# Patient Record
Sex: Female | Born: 1970 | Race: White | Hispanic: No | Marital: Single | Smoking: Former smoker
Health system: Southern US, Community
[De-identification: ages and names within clinical notes are randomized; demographics above are authoritative.]

## PROBLEM LIST (undated history)

## (undated) DIAGNOSIS — I10 Essential (primary) hypertension: Secondary | ICD-10-CM

## (undated) DIAGNOSIS — D249 Benign neoplasm of unspecified breast: Secondary | ICD-10-CM

## (undated) DIAGNOSIS — N63 Unspecified lump in unspecified breast: Secondary | ICD-10-CM

## (undated) DIAGNOSIS — M722 Plantar fascial fibromatosis: Secondary | ICD-10-CM

## (undated) DIAGNOSIS — Z87891 Personal history of nicotine dependence: Secondary | ICD-10-CM

## (undated) DIAGNOSIS — G4730 Sleep apnea, unspecified: Secondary | ICD-10-CM

## (undated) DIAGNOSIS — Z1211 Encounter for screening for malignant neoplasm of colon: Secondary | ICD-10-CM

## (undated) DIAGNOSIS — M7730 Calcaneal spur, unspecified foot: Secondary | ICD-10-CM

## (undated) DIAGNOSIS — E119 Type 2 diabetes mellitus without complications: Secondary | ICD-10-CM

## (undated) HISTORY — DX: Morbid (severe) obesity due to excess calories: E66.01

## (undated) HISTORY — DX: Calcaneal spur, unspecified foot: M77.30

## (undated) HISTORY — DX: Type 2 diabetes mellitus without complications: E11.9

## (undated) HISTORY — DX: Benign neoplasm of unspecified breast: D24.9

## (undated) HISTORY — DX: Unspecified lump in unspecified breast: N63.0

## (undated) HISTORY — DX: Encounter for screening for malignant neoplasm of colon: Z12.11

## (undated) HISTORY — DX: Sleep apnea, unspecified: G47.30

## (undated) HISTORY — DX: Plantar fascial fibromatosis: M72.2

## (undated) HISTORY — DX: Personal history of nicotine dependence: Z87.891

## (undated) HISTORY — DX: Essential (primary) hypertension: I10

---

## 2008-02-12 ENCOUNTER — Other Ambulatory Visit: Payer: Self-pay

## 2008-02-12 ENCOUNTER — Emergency Department: Payer: Self-pay | Admitting: Emergency Medicine

## 2008-05-12 ENCOUNTER — Ambulatory Visit: Payer: Self-pay | Admitting: Internal Medicine

## 2009-11-10 ENCOUNTER — Ambulatory Visit: Payer: Self-pay | Admitting: *Deleted

## 2010-06-25 HISTORY — PX: COLONOSCOPY: SHX174

## 2010-08-21 ENCOUNTER — Ambulatory Visit: Payer: Self-pay | Admitting: Internal Medicine

## 2010-09-14 ENCOUNTER — Ambulatory Visit: Payer: Self-pay | Admitting: Gastroenterology

## 2010-09-18 LAB — PATHOLOGY REPORT

## 2010-10-02 ENCOUNTER — Other Ambulatory Visit: Payer: Self-pay | Admitting: Physician Assistant

## 2010-10-12 ENCOUNTER — Ambulatory Visit: Payer: Self-pay | Admitting: Gastroenterology

## 2010-10-18 ENCOUNTER — Ambulatory Visit: Payer: Self-pay | Admitting: Gastroenterology

## 2011-06-12 ENCOUNTER — Emergency Department: Payer: Self-pay | Admitting: Emergency Medicine

## 2011-06-15 ENCOUNTER — Ambulatory Visit: Payer: Self-pay | Admitting: Internal Medicine

## 2011-06-26 DIAGNOSIS — I10 Essential (primary) hypertension: Secondary | ICD-10-CM

## 2011-06-26 DIAGNOSIS — D249 Benign neoplasm of unspecified breast: Secondary | ICD-10-CM

## 2011-06-26 HISTORY — DX: Benign neoplasm of unspecified breast: D24.9

## 2011-06-26 HISTORY — DX: Essential (primary) hypertension: I10

## 2012-01-17 ENCOUNTER — Ambulatory Visit: Payer: Self-pay

## 2012-06-25 HISTORY — PX: LAPAROSCOPIC GASTRIC SLEEVE RESECTION: SHX5895

## 2012-11-03 ENCOUNTER — Encounter: Payer: Self-pay | Admitting: *Deleted

## 2013-01-16 ENCOUNTER — Ambulatory Visit: Payer: Self-pay | Admitting: Specialist

## 2013-01-16 DIAGNOSIS — Z01810 Encounter for preprocedural cardiovascular examination: Secondary | ICD-10-CM

## 2013-01-16 LAB — COMPREHENSIVE METABOLIC PANEL
Alkaline Phosphatase: 104 U/L (ref 50–136)
Anion Gap: 4 — ABNORMAL LOW (ref 7–16)
BUN: 14 mg/dL (ref 7–18)
Calcium, Total: 8.7 mg/dL (ref 8.5–10.1)
Chloride: 104 mmol/L (ref 98–107)
Osmolality: 276 (ref 275–301)
SGPT (ALT): 24 U/L (ref 12–78)

## 2013-01-16 LAB — CBC WITH DIFFERENTIAL/PLATELET
Basophil #: 0 10*3/uL (ref 0.0–0.1)
Basophil %: 0.2 %
Eosinophil #: 0.2 10*3/uL (ref 0.0–0.7)
Eosinophil %: 4.4 %
HCT: 34.6 % — ABNORMAL LOW (ref 35.0–47.0)
HGB: 12.1 g/dL (ref 12.0–16.0)
MCH: 31.7 pg (ref 26.0–34.0)
MCHC: 35.1 g/dL (ref 32.0–36.0)
MCV: 90 fL (ref 80–100)
Monocyte #: 0.4 x10 3/mm (ref 0.2–0.9)
Neutrophil %: 65.2 %
Platelet: 280 10*3/uL (ref 150–440)
RBC: 3.83 10*6/uL (ref 3.80–5.20)
RDW: 14.3 % (ref 11.5–14.5)
WBC: 5.4 10*3/uL (ref 3.6–11.0)

## 2013-01-16 LAB — IRON AND TIBC
Iron Saturation: 33 %
Unbound Iron-Bind.Cap.: 215 ug/dL

## 2013-01-16 LAB — PROTIME-INR
INR: 1
Prothrombin Time: 12.9 secs (ref 11.5–14.7)

## 2013-01-16 LAB — FERRITIN: Ferritin (ARMC): 53 ng/mL (ref 8–388)

## 2013-01-16 LAB — AMYLASE: Amylase: 28 U/L (ref 25–115)

## 2013-01-16 LAB — HEMOGLOBIN A1C: Hemoglobin A1C: 6.6 % — ABNORMAL HIGH (ref 4.2–6.3)

## 2013-01-16 LAB — MAGNESIUM: Magnesium: 1.6 mg/dL — ABNORMAL LOW

## 2013-01-16 LAB — TSH: Thyroid Stimulating Horm: 1.3 u[IU]/mL

## 2013-01-16 LAB — PHOSPHORUS: Phosphorus: 2.8 mg/dL (ref 2.5–4.9)

## 2013-01-22 ENCOUNTER — Ambulatory Visit: Payer: Self-pay | Admitting: General Surgery

## 2013-02-13 ENCOUNTER — Ambulatory Visit: Payer: Self-pay | Admitting: Specialist

## 2013-02-23 ENCOUNTER — Ambulatory Visit: Payer: Self-pay | Admitting: Specialist

## 2013-02-24 ENCOUNTER — Encounter: Payer: Self-pay | Admitting: *Deleted

## 2013-04-28 ENCOUNTER — Ambulatory Visit: Payer: Self-pay | Admitting: Specialist

## 2013-05-12 ENCOUNTER — Inpatient Hospital Stay: Payer: Self-pay | Admitting: Surgery

## 2013-05-12 LAB — CREATININE, SERUM
Creatinine: 0.63 mg/dL (ref 0.60–1.30)
EGFR (Non-African Amer.): >60

## 2013-05-13 LAB — PHOSPHORUS: Phosphorus: 3 mg/dL (ref 2.5–4.9)

## 2013-05-13 LAB — CBC WITH DIFFERENTIAL/PLATELET
Basophil %: 0.2 %
Eosinophil #: 0 10*3/uL (ref 0.0–0.7)
HCT: 33.5 % — ABNORMAL LOW (ref 35.0–47.0)
Lymphocyte #: 1 10*3/uL (ref 1.0–3.6)
Lymphocyte %: 13.9 %
MCHC: 34.6 g/dL (ref 32.0–36.0)
MCV: 91 fL (ref 80–100)
Monocyte %: 9.1 %
Neutrophil #: 5.5 10*3/uL (ref 1.4–6.5)
Neutrophil %: 76.7 %
Platelet: 219 10*3/uL (ref 150–440)
RBC: 3.68 10*6/uL — ABNORMAL LOW (ref 3.80–5.20)
RDW: 13.2 % (ref 11.5–14.5)
WBC: 7.2 10*3/uL (ref 3.6–11.0)

## 2013-05-13 LAB — BASIC METABOLIC PANEL
Anion Gap: 4 — ABNORMAL LOW (ref 7–16)
BUN: 14 mg/dL (ref 7–18)
Calcium, Total: 7.9 mg/dL — ABNORMAL LOW (ref 8.5–10.1)
Chloride: 101 mmol/L (ref 98–107)
Creatinine: 0.59 mg/dL — ABNORMAL LOW (ref 0.60–1.30)
EGFR (African American): >60
Osmolality: 277 (ref 275–301)
Potassium: 3.2 mmol/L — ABNORMAL LOW (ref 3.5–5.1)

## 2013-05-13 LAB — ALBUMIN: Albumin: 3.3 g/dL — ABNORMAL LOW (ref 3.4–5.0)

## 2013-05-13 LAB — MAGNESIUM: Magnesium: 2.3 mg/dL

## 2013-05-14 LAB — PATHOLOGY REPORT

## 2013-06-02 ENCOUNTER — Ambulatory Visit: Payer: Self-pay | Admitting: Specialist

## 2013-06-25 ENCOUNTER — Ambulatory Visit: Payer: Self-pay | Admitting: Specialist

## 2013-09-17 ENCOUNTER — Emergency Department: Payer: Self-pay | Admitting: Emergency Medicine

## 2013-09-17 LAB — BASIC METABOLIC PANEL
ANION GAP: 3 — AB (ref 7–16)
BUN: 16 mg/dL (ref 7–18)
Calcium, Total: 8.9 mg/dL (ref 8.5–10.1)
Chloride: 104 mmol/L (ref 98–107)
Co2: 31 mmol/L (ref 21–32)
Creatinine: 0.51 mg/dL — ABNORMAL LOW (ref 0.60–1.30)
EGFR (African American): >60
EGFR (Non-African Amer.): >60
GLUCOSE: 100 mg/dL — AB (ref 65–99)
OSMOLALITY: 277 (ref 275–301)
Potassium: 3.5 mmol/L (ref 3.5–5.1)
Sodium: 138 mmol/L (ref 136–145)

## 2013-09-17 LAB — CBC
HCT: 39.7 % (ref 35.0–47.0)
HGB: 13.4 g/dL (ref 12.0–16.0)
MCH: 31.4 pg (ref 26.0–34.0)
MCHC: 33.7 g/dL (ref 32.0–36.0)
MCV: 93 fL (ref 80–100)
Platelet: 249 10*3/uL (ref 150–440)
RBC: 4.25 10*6/uL (ref 3.80–5.20)
RDW: 14 % (ref 11.5–14.5)
WBC: 4.8 10*3/uL (ref 3.6–11.0)

## 2013-09-17 LAB — HCG, QUANTITATIVE, PREGNANCY: Beta Hcg, Quant.: <1 m[IU]/mL — ABNORMAL LOW

## 2014-04-26 ENCOUNTER — Encounter: Payer: Self-pay | Admitting: *Deleted

## 2014-10-15 NOTE — Op Note (Signed)
PATIENT NAME:  Chloe Reeves, Chloe Reeves MR#:  [id number] DATE OF BIRTH:  03/30/1971  DATE OF PROCEDURE:  05/12/2013  PREOPERATIVE DIAGNOSIS: Morbid obesity.  POSTOPERATIVE DIAGNOSIS: Morbid obesity and hiatal hernia.  PROCEDURE: Laparoscopic sleeve gastrectomy with hiatal hernia repair.   SURGEON: Kreg Shropshire, MD  ASSISTANT: Valaria Good, PA.   ANESTHESIA: General endotracheal.   CLINICAL HISTORY: See history and physical.   DETAILS OF PROCEDURE: The patient was taken to the operating room and placed on the operating room table, in the supine position, with appropriate monitors and supplemental oxygen being delivered.  Broad spectrum IV antibiotics were administered. The patient was placed under general anesthesia without incident.  The abdomen was prepped and draped in the usual sterile fashion.  Access was obtained using 5 mm Optical trocar. Pneumoperitoneum was established without difficulty. Multiple other ports were placed in preparation for sleeve gastrectomy. A liver retractor was placed without incident. The entire stomach was mobilized from 5 cm from the pylorus all the way up to the fundus, and the fundus was mobilized off the left crura as well completely freeing up the posterior portion of the stomach. Posterior attachments were taken down so the crura could be visualized from both sides.  At that point, Seamguard was used to buttress all staple fires and no provocative leak test was performed. everything was removed from the stomach and a Visi-G 36-French was placed down into the antrum. An Echelon green load stapler was used to bisect the antrum on first fire starting approximately 5 to 6 cm from the pylorus. I then continued up along the Bougie using a blue load stapler with excellent affect with care not to get too close to the Bougie itself, with minimal traction. This continued all the way up to the left crura. The excess stomach was placed on the side and the Bougie was removed and endoscopy  showed no evidence of obstruction at that time.  The excess stomach was removed through the abdominal cavity, and the wounds were closed using 4-0 Vicryl and Dermabond.  The hiatal hernia was coapted in the following fashion: I dissected the right and left crura freer away from the esophagus with the Visi-G in place and then reapproximated them using interrupted Ethibond sutures.  ____________________________ Kreg Shropshire, MD jb:aw D: 05/12/2013 14:12:33 ET T: 05/12/2013 15:03:51 ET JOB#: [id number]  cc: Kreg Shropshire, MD, <Dictator> Bonner Puna MD ELECTRONICALLY SIGNED 05/12/2013 15:46

## 2014-10-15 NOTE — H&P (Signed)
   Subjective/Chief Complaint morbid obesity   History of Present Illness for sleeve surgery   Past Med/Surgical Hx:  sleep apnea:   diabetes:   htn:   borderline HTN:   denies:   ALLERGIES:  Benadryl: Hives, Anaphylaxis, Agitation, Anxiety  Physical Exam:  GEN well developed, well nourished, obese   HEENT PERRL   NECK supple   RESP normal resp effort   CARD regular rate   ABD denies tenderness   LYMPH negative neck   SKIN normal to palpation    Assessment/Admission Diagnosis morbid obesity   Plan laparoscopic sleeve gastrectomy   Electronic Signatures: Bonner Puna (MD)  (Signed [phone] 12:48)  Authored: CHIEF COMPLAINT and HISTORY, PAST MEDICAL/SURGIAL HISTORY, ALLERGIES, PHYSICAL EXAM, ASSESSMENT AND PLAN   Last Updated: 18-Nov-14 12:48 by Bonner Puna (MD)

## 2016-03-14 ENCOUNTER — Encounter: Payer: Self-pay | Admitting: Intensive Care

## 2016-03-14 ENCOUNTER — Emergency Department: Payer: Self-pay

## 2016-03-14 ENCOUNTER — Inpatient Hospital Stay
Admission: EM | Admit: 2016-03-14 | Discharge: 2016-03-20 | DRG: 357 | Disposition: A | Discharge status: Home / Self Care | Payer: Self-pay | Attending: Surgery | Admitting: Surgery

## 2016-03-14 DIAGNOSIS — E119 Type 2 diabetes mellitus without complications: Secondary | ICD-10-CM | POA: Diagnosis present

## 2016-03-14 DIAGNOSIS — K56609 Unspecified intestinal obstruction, unspecified as to partial versus complete obstruction: Secondary | ICD-10-CM | POA: Diagnosis present

## 2016-03-14 DIAGNOSIS — Z888 Allergy status to other drugs, medicaments and biological substances status: Secondary | ICD-10-CM

## 2016-03-14 DIAGNOSIS — Z6841 Body Mass Index (BMI) 40.0 and over, adult: Secondary | ICD-10-CM

## 2016-03-14 DIAGNOSIS — I10 Essential (primary) hypertension: Secondary | ICD-10-CM | POA: Diagnosis present

## 2016-03-14 DIAGNOSIS — K5669 Other intestinal obstruction: Secondary | ICD-10-CM

## 2016-03-14 DIAGNOSIS — Z87891 Personal history of nicotine dependence: Secondary | ICD-10-CM

## 2016-03-14 DIAGNOSIS — Z23 Encounter for immunization: Secondary | ICD-10-CM

## 2016-03-14 DIAGNOSIS — Z9884 Bariatric surgery status: Secondary | ICD-10-CM

## 2016-03-14 DIAGNOSIS — G4730 Sleep apnea, unspecified: Secondary | ICD-10-CM | POA: Diagnosis present

## 2016-03-14 DIAGNOSIS — K5660 Unspecified intestinal obstruction: Principal | ICD-10-CM | POA: Diagnosis present

## 2016-03-14 DIAGNOSIS — R110 Nausea: Secondary | ICD-10-CM

## 2016-03-14 DIAGNOSIS — R1084 Generalized abdominal pain: Secondary | ICD-10-CM

## 2016-03-14 LAB — CBC
HCT: 40.5 % (ref 35.0–47.0)
Hemoglobin: 14 g/dL (ref 12.0–16.0)
MCH: 31.8 pg (ref 26.0–34.0)
MCHC: 34.6 g/dL (ref 32.0–36.0)
MCV: 91.7 fL (ref 80.0–100.0)
PLATELETS: 308 10*3/uL (ref 150–440)
RBC: 4.42 MIL/uL (ref 3.80–5.20)
RDW: 13.3 % (ref 11.5–14.5)
WBC: 12.1 10*3/uL — ABNORMAL HIGH (ref 3.6–11.0)

## 2016-03-14 LAB — URINALYSIS COMPLETE WITH MICROSCOPIC (ARMC ONLY)
Bacteria, UA: NONE SEEN
Bilirubin Urine: NEGATIVE
Glucose, UA: NEGATIVE mg/dL
Ketones, ur: NEGATIVE mg/dL
Leukocytes, UA: NEGATIVE
Nitrite: NEGATIVE
Protein, ur: NEGATIVE mg/dL
Specific Gravity, Urine: 1.008 (ref 1.005–1.030)
pH: 5 (ref 5.0–8.0)

## 2016-03-14 LAB — COMPREHENSIVE METABOLIC PANEL
ALK PHOS: 80 U/L (ref 38–126)
ALT: 17 U/L (ref 14–54)
AST: 21 U/L (ref 15–41)
Albumin: 4.1 g/dL (ref 3.5–5.0)
Anion gap: 7 (ref 5–15)
BUN: 14 mg/dL (ref 6–20)
CALCIUM: 9.5 mg/dL (ref 8.9–10.3)
CO2: 30 mmol/L (ref 22–32)
CREATININE: 0.61 mg/dL (ref 0.44–1.00)
Chloride: 101 mmol/L (ref 101–111)
GFR calc non Af Amer: >60 mL/min (ref >60)
GLUCOSE: 182 mg/dL — AB (ref 65–99)
Potassium: 4.1 mmol/L (ref 3.5–5.1)
SODIUM: 138 mmol/L (ref 135–145)
Total Bilirubin: 0.5 mg/dL (ref 0.3–1.2)
Total Protein: 7.9 g/dL (ref 6.5–8.1)

## 2016-03-14 LAB — POCT PREGNANCY, URINE: Preg Test, Ur: NEGATIVE

## 2016-03-14 LAB — LIPASE, BLOOD: Lipase: 20 U/L (ref 11–51)

## 2016-03-14 MED ORDER — KCL IN DEXTROSE-NACL 20-5-0.45 MEQ/L-%-% IV SOLN
INTRAVENOUS | Status: DC
  Administered 2016-03-14 – 2016-03-19 (×7): via INTRAVENOUS
  Filled 2016-03-14 (×9): qty 1000

## 2016-03-14 MED ORDER — ACETAMINOPHEN 650 MG RE SUPP: 650.0000 mg | Freq: Four times a day (QID) | RECTAL | Status: DC | PRN

## 2016-03-14 MED ORDER — LISINOPRIL 20 MG PO TABS
20.0000 mg | ORAL_TABLET | Freq: Every day | ORAL | Status: DC
  Filled 2016-03-14 (×2): qty 1

## 2016-03-14 MED ORDER — IOPAMIDOL (ISOVUE-300) INJECTION 61%
100.0000 mL | Freq: Once | INTRAVENOUS | Status: AC | PRN
  Administered 2016-03-14: 100 mL via INTRAVENOUS

## 2016-03-14 MED ORDER — ACETAMINOPHEN 500 MG PO TABS
1000.0000 mg | ORAL_TABLET | ORAL | Status: AC
  Administered 2016-03-14: 1000 mg via ORAL
  Filled 2016-03-14: qty 2

## 2016-03-14 MED ORDER — ONDANSETRON HCL 4 MG/2ML IJ SOLN
4.0000 mg | Freq: Once | INTRAMUSCULAR | Status: AC
  Administered 2016-03-14: 4 mg via INTRAVENOUS
  Filled 2016-03-14: qty 2

## 2016-03-14 MED ORDER — DIATRIZOATE MEGLUMINE & SODIUM 66-10 % PO SOLN: 15.0000 mL | Freq: Once | ORAL | Status: DC

## 2016-03-14 MED ORDER — HYDROCHLOROTHIAZIDE 25 MG PO TABS
25.0000 mg | ORAL_TABLET | Freq: Every day | ORAL | Status: DC
  Filled 2016-03-14 (×2): qty 1

## 2016-03-14 MED ORDER — LISINOPRIL-HYDROCHLOROTHIAZIDE 20-25 MG PO TABS: 1.0000 | ORAL_TABLET | Freq: Every day | ORAL | Status: DC

## 2016-03-14 MED ORDER — FAMOTIDINE IN NACL 20-0.9 MG/50ML-% IV SOLN
20.0000 mg | Freq: Two times a day (BID) | INTRAVENOUS | Status: DC
  Administered 2016-03-14 – 2016-03-20 (×11): 20 mg via INTRAVENOUS
  Filled 2016-03-14 (×15): qty 50

## 2016-03-14 MED ORDER — ENOXAPARIN SODIUM 40 MG/0.4ML ~~LOC~~ SOLN
40.0000 mg | SUBCUTANEOUS | Status: DC
  Administered 2016-03-14 – 2016-03-18 (×3): 40 mg via SUBCUTANEOUS
  Filled 2016-03-14 (×3): qty 0.4

## 2016-03-14 MED ORDER — MEDROXYPROGESTERONE ACETATE 10 MG PO TABS
10.0000 mg | ORAL_TABLET | Freq: Every day | ORAL | Status: DC
  Filled 2016-03-14 (×4): qty 1

## 2016-03-14 MED ORDER — MORPHINE SULFATE (PF) 4 MG/ML IV SOLN
4.0000 mg | INTRAVENOUS | Status: DC | PRN
  Administered 2016-03-15 (×2): 4 mg via INTRAVENOUS
  Filled 2016-03-14 (×2): qty 1

## 2016-03-14 MED ORDER — ACETAMINOPHEN 325 MG PO TABS: 650.0000 mg | ORAL_TABLET | Freq: Four times a day (QID) | ORAL | Status: DC | PRN

## 2016-03-14 MED ORDER — DIATRIZOATE MEGLUMINE & SODIUM 66-10 % PO SOLN: 30.0000 mL | Freq: Once | ORAL | Status: DC

## 2016-03-14 MED ORDER — INFLUENZA VAC SPLIT QUAD 0.5 ML IM SUSY
0.5000 mL | PREFILLED_SYRINGE | INTRAMUSCULAR | Status: AC
  Administered 2016-03-15: 0.5 mL via INTRAMUSCULAR
  Filled 2016-03-14: qty 0.5

## 2016-03-14 MED ORDER — PAROXETINE HCL 20 MG PO TABS
20.0000 mg | ORAL_TABLET | Freq: Every day | ORAL | Status: DC
  Filled 2016-03-14 (×3): qty 1

## 2016-03-14 MED ORDER — SODIUM CHLORIDE 0.9 % IV BOLUS (SEPSIS)
1000.0000 mL | Freq: Once | INTRAVENOUS | Status: AC
  Administered 2016-03-14: 1000 mL via INTRAVENOUS

## 2016-03-14 MED ORDER — IOPAMIDOL (ISOVUE-300) INJECTION 61%
30.0000 mL | Freq: Once | INTRAVENOUS | Status: AC | PRN
  Administered 2016-03-14: 30 mL via ORAL

## 2016-03-14 MED ORDER — HYDROCODONE-ACETAMINOPHEN 5-325 MG PO TABS
1.0000 | ORAL_TABLET | ORAL | Status: DC | PRN
  Administered 2016-03-14 (×2): 2 via ORAL
  Filled 2016-03-14 (×2): qty 2

## 2016-03-14 NOTE — ED Provider Notes (Signed)
[facility] Emergency Department Provider Note   ____________________________________________   First MD Initiated Contact with Patient 03/14/16 [phone]     (approximate)  I have reviewed the triage vital signs and the nursing notes.   HISTORY  Chief Complaint Abdominal Pain    HPI Chloe Reeves is a 45 y.o. female reports in her normal health, this morning about 3 AM she was awoken by sharp abdominal pain in the middle of her abdomen. Says he mild nausea. No fevers or chills but she did feel "warm and cold sweats".  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 She reports that the pain has started to subside, and is now very mild to minimal and located about the area of her belly button. Felt as though the pain is going to make her vomit but did not. She felt very lightheaded and reports that the pain is making her feel like she could pass out  No chest pain. No measured fever. No changes in urination, no abnormal odor or blood in her urine. She has a previous history of gastric bypass   Past Medical History:  Diagnosis Date  . 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 Diabetes mellitus without complication ([place])   . Heel spur   . Hypertension 2013  . Lump or mass in breast   . Morbid obesity ([place])   . Personal history of tobacco use, presenting hazards to health   . Plantar fasciitis   . Sleep apnea   . Special screening for malignant neoplasms, colon     Patient Active Problem List   Diagnosis Date Noted  . SBO (small bowel obstruction) ([place]) 03/14/2016    Past Surgical History:  Procedure Laterality Date  . COLONOSCOPY  2012   Dr. Ernst Breach    Prior to Admission medications   Medication Sig Start Date End Date Taking? Authorizing Provider  acetaminophen (TYLENOL) 325 MG tablet Take 650 mg by mouth every 6 (six) hours as needed.   Yes  Historical Provider, MD    Allergies Scopolamine and Benadryl [diphenhydramine hcl]  Family History  Problem Relation Age of Onset  . Cancer Other     pt is unsure of details regarding FH of cancer    Social History Social History  Substance Use Topics  . Smoking status: Former Research scientist (life sciences)  . Smokeless tobacco: Never Used  . Alcohol use Yes     Comment: Occ    Review of Systems Constitutional: No fever/chills. Felt warm earlier Eyes: No visual changes. ENT: No sore throat. Cardiovascular: Denies chest pain. Respiratory: Denies shortness of breath. Gastrointestinal:  No diarrhea.  No constipation. Genitourinary: Negative for dysuria. Musculoskeletal: Negative for back pain. Skin: Negative for rash. Neurological: Negative for headaches, focal weakness or numbness.  10-point ROS otherwise negative.  ____________________________________________   PHYSICAL EXAM:  VITAL SIGNS: ED Triage Vitals  Enc Vitals Group     BP 03/14/16 0710 140/82     Pulse Rate 03/14/16 0708 88     Resp 03/14/16 0708 16     Temp 03/14/16 0708 98.1 F (36.7 C)     Temp Source 03/14/16 0708 Oral     SpO2 03/14/16 0708 97 %     Weight 03/14/16 0709 225 lb (102.1 kg)     Height 03/14/16 0709 5\' 2"  (1.575 m)     Head Circumference --  Peak Flow --      Pain Score 03/14/16 0713 10     Pain Loc --      Pain Edu? --      Excl. in [place]? --     Constitutional: Alert and oriented. Well appearing and in no acute distress. Eyes: Conjunctivae are normal. PERRL. EOMI. Head: Atraumatic. Nose: No congestion/rhinnorhea. Mouth/Throat: Mucous membranes are moist.  Oropharynx non-erythematous. Neck: No stridor.   Cardiovascular: Normal rate, regular rhythm. Grossly normal heart sounds.  Good peripheral circulation. Respiratory: Normal respiratory effort.  No retractions. Lungs CTAB. Gastrointestinal: Soft and nontender except for mild tenderness periumbilically without hernia, rebound or guarding. No  pain in the lower abdomen. No distention. Denies vaginal bleeding, discharge, or being at risk to be pregnant. Musculoskeletal: No lower extremity tenderness nor edema.  No joint effusions. Neurologic:  Normal speech and language. No gross focal neurologic deficits are appreciated.  Skin:  Skin is warm, dry and intact. No rash noted. Psychiatric: Mood and affect are normal. Speech and behavior are normal.  ____________________________________________   LABS (all labs ordered are listed, but only abnormal results are displayed)  Labs Reviewed  COMPREHENSIVE METABOLIC PANEL - Abnormal; Notable for the following:       Result Value   Glucose, Bld 182 (*)    All other components within normal limits  CBC - Abnormal; Notable for the following:    WBC 12.1 (*)    All other components within normal limits  URINALYSIS COMPLETEWITH MICROSCOPIC ([facility] ONLY) - Abnormal; Notable for the following:    Color, Urine YELLOW (*)    APPearance CLEAR (*)    Hgb urine dipstick 1+ (*)    Squamous Epithelial / LPF 0-5 (*)    All other components within normal limits  LIPASE, BLOOD  POC URINE PREG, ED  POCT PREGNANCY, URINE   ____________________________________________  EKG   ____________________________________________  RADIOLOGY  Ct Abdomen Pelvis W Contrast  Result Date: 03/14/2016 CLINICAL DATA:  Patient is status post gastric bypass procedure. Lower abdominal pain with nausea and vomiting, acute EXAM: CT ABDOMEN AND PELVIS WITH CONTRAST TECHNIQUE: Multidetector CT imaging of the abdomen and pelvis was performed using the standard protocol following bolus administration of intravenous contrast. Oral contrast was also administered. CONTRAST:  13mL ISOVUE-300 IOPAMIDOL (ISOVUE-300) INJECTION 61% COMPARISON:  June 13, 2011 FINDINGS: Lower chest: Lung bases are clear. Hepatobiliary: No focal liver lesions are identified. Gallbladder wall is not appreciably thickened. There is no biliary duct  dilatation. Pancreas: There is no pancreatic mass or inflammatory focus. Spleen: No splenic lesions are evident. Adrenals/Urinary Tract: Adrenals appear unremarkable. Kidneys bilaterally show no evident mass or hydronephrosis on either side. There is no renal or ureteral calculus on either side. Urinary bladder is midline with wall thickness within normal limits. Stomach/Bowel: The patient is status post gastric bypass procedure. There is no wall thickening or fluid in the region of the previous gastric bypass. There are loops of dilated small bowel in the right mid abdomen with a transition zone just beyond this area of bowel dilatation. There is bowel wall thickening in this area. There is surrounding mesenteric thickening in this area. The appearance is consistent with a developing closed loop obstruction from internal hernia in the right mid abdomen. There is no free air or portal venous air. Bowel elsewhere appears unremarkable. Vascular/Lymphatic: There is no abdominal aortic aneurysm. No vascular lesions are evident on this study. No adenopathy is appreciable in the abdomen or pelvis. Reproductive: Uterus is  anteverted. There is no pelvic mass or pelvic fluid collection. Other: Appendix appears unremarkable. There is no abscess. A small amount of ascites is noted near the liver edge. Musculoskeletal: There are no blastic or lytic bone lesions. There are scattered foci of degenerative change in the lower thoracic and lumbar spine. No intramuscular or abdominal wall lesion. IMPRESSION: Patient is status post gastric bypass procedure. Developing closed loop small-bowel obstruction in the right mid abdomen. There is a transition zone in this area. Contrast does extend through this area. There is wall thickening in the area of the internal hernia/closed loop as well as mesenteric thickening. No free air. Small amount of ascites. No abscess. Appendix region appears normal. No renal or ureteral calculus. No  hydronephrosis. Critical Value/emergent results were called by telephone at the time of interpretation on 03/14/2016 at 9:35 am to Dr. Delman Kitten , who verbally acknowledged these results. Critical Value/emergent results were called by telephone at the time of interpretation on 03/14/2016 at 9:35 am to Dr. Delman Kitten , who verbally acknowledged these results. Electronically Signed   By: Lowella Grip III M.D.   On: 03/14/2016 09:35    ____________________________________________   PROCEDURES  Procedure(s) performed: None  Procedures  Critical Care performed: No  ____________________________________________   INITIAL IMPRESSION / ASSESSMENT AND PLAN / ED COURSE  Pertinent labs & imaging results that were available during my care of the patient were reviewed by me and considered in my medical decision making (see chart for details).  Differential diagnosis includes but is not limited to, abdominal perforation, aortic dissection, cholecystitis, appendicitis, diverticulitis, colitis, esophagitis/gastritis, kidney stone, pyelonephritis, urinary tract infection, aortic aneurysm. All are considered in decision and treatment plan. Based upon the patient's presentation and risk factors, and the patient's previous history of a gastric bypass who proceed with CT scan. Overall I think it is reassuring that her pain has improved without treatment, but given the patient's previous history of a bypass and a report of severe pain is now subsiding we'll proceed to evaluate for acute conditions.  Patient reports that she would like to be able to drive home, she cannot tolerate NSAIDs, and thus we will give her Tylenol but the patient last for stronger pain medicine if required. Currently she is resting comfortably reports her pain is very minimal and is much better than it was when it woke her up.  Overall nontoxic well appearing.  ----------------------------------------- 10:55 AM on  03/14/2016 -----------------------------------------   10 AM - case discussed with Dr. Pat Patrick, he is currently in the OR, but recommends that we contact patient's bariatric surgeon regarding today's concerns of bowel obstruction. Patient understands, she is currently resting comfortably reports pain is a 5, no vomiting, awaiting word from her surgeon  ----------------------------------------- 10:48 AM on 03/14/2016 -----------------------------------------  Two pages called into the patient's physician, Dr. Synthia Innocent office, no return call back yet.  ----------------------------------------- 10:55 AM on 03/14/2016 -----------------------------------------  Dr. Synthia Innocent PA, Romie Levee called back. Discussed case with her and she advises that Dr. Darnell Level does not have general surgical privileges at [facility], they advised having general surgery see and perform consultation on the patient. Dr. Pat Patrick paged for consult.  Clarified with patient, patient had a gastric sleeve procedure. Discussed with Dr. Pat Patrick, he is currently in the OR but will see patient once out. He is comfortable resting at this time.   Clinical Course     ____________________________________________   FINAL CLINICAL IMPRESSION(S) / ED DIAGNOSES  Final diagnoses:  Small  bowel obstruction ([facility])      NEW MEDICATIONS STARTED DURING THIS VISIT:  New Prescriptions   No medications on file     Note:  This document was prepared using Dragon voice recognition software and may include unintentional dictation errors.     Delman Kitten, MD 03/14/16 [phone]

## 2016-03-14 NOTE — ED Notes (Signed)
Transferring to 2C-219

## 2016-03-14 NOTE — ED Triage Notes (Signed)
Pt presents to ER with lower abdominal pain that woke her up from her sleep at 0300. Pt reports Nausea and vomiting X2 since this AM. Pt states " Im breaking out in cold sweats and I feel like Im going to pass out" Pt ambulatory in triage. Pt leaning over holding stomach. A&O x4

## 2016-03-14 NOTE — H&P (Signed)
Chloe Reeves is a 45 y.o. female  in her normal state of good health until 3:00 morning when she developed severe abdominal pain.  HPI: She developed the sudden onset of abdominal discomfort primarily to the right side approximately 3:00 in the morning. It awoke her from a dead sleep. Pain was intense without any mitigating or exacerbating factors. She became nauseated later in the morning and vomited once although not a great deal. The pain improved once patient began to vomit. She denied any fever but did have significant diaphoresis. He is similar problem. Her only previous GI problems as been gastric sleeve stapling procedure. She denies history of hepatitis yellow jaundice pancreatitis peptic ulcer disease gallbladder disease or diverticulitis. She has no other major medical problems.  Her last bowel movement was early this morning. She's not been passing gas. Of note, is the fact she's had no narcotic pain medication while she's been in the emergency room and her pain has improved. She has minimal abdominal pain or tenderness present time. He is no longer vomiting.  Workup revealed a slightly elevated white blood cell count of 12,000. CT scan demonstrated a" developing closed-loop obstruction". Contrast did go through the entire small bowel. There did not appear to be an obvious volvulus. I did not see any evidence of an internal hernia on the CT scan. Surgical service was consulted.  Past Medical History:  Diagnosis Date  . 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 Diabetes mellitus without complication ([place])   . Heel spur   . Hypertension 2013  . Lump or mass in breast   . Morbid obesity ([place])   . Personal history of tobacco use, presenting hazards to health   . Plantar fasciitis   . Sleep apnea   . Special screening for malignant neoplasms, colon    Past Surgical History:  Procedure Laterality Date  . COLONOSCOPY  2012   Dr. Ernst Breach   Social History    Social History  . Marital status: Married    Spouse name: N/A  . Number of children: N/A  . Years of education: N/A   Social History Main Topics  . Smoking status: Former Research scientist (life sciences)  . Smokeless tobacco: Never Used  . Alcohol use Yes     Comment: Occ  . Drug use: No  . Sexual activity: Not Asked   Other Topics Concern  . None   Social History Narrative  . None     Review of Systems  Constitutional: Positive for diaphoresis. Negative for chills and fever.  HENT: Negative.   Eyes: Negative.   Respiratory: Negative for cough, shortness of breath and wheezing.   Cardiovascular: Negative for chest pain and palpitations.  Gastrointestinal: Positive for abdominal pain, nausea and vomiting. Negative for diarrhea and heartburn.  Genitourinary: Negative.   Musculoskeletal: Negative.   Skin: Negative.   Neurological: Negative.   Psychiatric/Behavioral: Negative.      PHYSICAL EXAM: BP 127/72   Pulse 62   Temp 98.1 F (36.7 C) (Oral)   Resp 16   Ht 5\' 2"  (1.575 m)   Wt 102.1 kg (225 lb)   LMP 02/26/2016   SpO2 98%   BMI 41.15 kg/m   Physical Exam  Constitutional: She is oriented to person, place, and time. She appears well-developed and well-nourished. No distress.  HENT:  Head: Normocephalic and atraumatic.  Eyes: EOM are normal. Pupils are equal, round, and reactive to light.  Neck: Normal range of motion. Neck supple.  Cardiovascular: Normal rate and regular rhythm.   Pulmonary/Chest: Effort normal and breath sounds normal.  Abdominal: Soft. Bowel sounds are normal. She exhibits no distension. There is no tenderness. There is no rebound and no guarding.  Musculoskeletal: Normal range of motion. She exhibits no edema or deformity.  Neurological: She is alert and oriented to person, place, and time.  Skin: Skin is warm and dry. She is not diaphoretic.  Psychiatric: She has a normal mood and affect. Her behavior is normal.   Her abdominal exam is unremarkable. She  does not have any significant abdominal tenderness. I cannot palpate a mass. She has no rebound or guarding.  Impression/Plan: I have independently reviewed her CT scan. Her proximal stomach and small bowel is not dilated and her distal small bowel was not dilated but there is a segment in the center of her small bowel in the right abdomen that does appear to be distended. Contrast flows from the stomach to the colon without difficulty. There does not appear to be any evidence of a volvulus. I am concerned about possibility that she had a closed loop obstruction 1 time but she is pain-free at the present time with no nausea or vomiting. Her abdominal exam is unremarkable. She's never had any previous similar symptoms. Nothing nasogastric decompression will be of any benefit since her stomach is not distended and she is no longer vomiting.  We will admit her to the hospital treat her with IV rehydration and some pain medication see how she does have the next few hours. I do not see an indication for urgent surgical intervention at this time. She is in agreement.   Dia Crawford III, MD  03/14/2016, 12:47 PM

## 2016-03-14 NOTE — ED Notes (Signed)
Called Dr. Synthia Innocent office a second time at 1032, initial call was at 1005.  Expressed to them this was a repeat call

## 2016-03-14 NOTE — Progress Notes (Signed)
   Visited with patient this evening. Patient reports that she does feel much improved than from the previous evening. She does endorse however that she has had some return of abdominal discomfort. It is mild in comparison to last night. She states that when she drinks liquids the discomfort intensified. She denies any nausea, vomiting.  On exam her abdomen is soft, mildly tender to deep palpation in the midline. She is not distended and has no guarding or rebound.  Had a [place] conversation with the patient about the complexity of her situation. Given that her pain is improved but not gone away discussed that it is still possible that she will require operative intervention. Discussed that any point should her pain intensified is to let her nurse know immediately so that I can reevaluate her. Otherwise, the plan is for repeat abdominal x-ray in the morning to visualize whether or not the contrast has made it past the area of concern seen on CT scan. Discussed that if the contrast is not progressing at that could be an indication for surgery. However, as [place] as the contrast makes it past the area of concern and into her colon then it would be evidence of an incomplete or partial obstruction and decrease the likelihood of needing surgery. Patient voiced understanding.  Clayburn Pert, MD [facility]

## 2016-03-14 NOTE — ED Notes (Signed)
Informed RN bed ready 

## 2016-03-15 ENCOUNTER — Observation Stay: Payer: Self-pay

## 2016-03-15 LAB — CBC
HCT: 35.1 % (ref 35.0–47.0)
HEMOGLOBIN: 12.2 g/dL (ref 12.0–16.0)
MCH: 32.4 pg (ref 26.0–34.0)
MCHC: 34.8 g/dL (ref 32.0–36.0)
MCV: 93.2 fL (ref 80.0–100.0)
Platelets: 252 10*3/uL (ref 150–440)
RBC: 3.76 MIL/uL — AB (ref 3.80–5.20)
RDW: 13.3 % (ref 11.5–14.5)
WBC: 5.3 10*3/uL (ref 3.6–11.0)

## 2016-03-15 LAB — BASIC METABOLIC PANEL
ANION GAP: 5 (ref 5–15)
BUN: <5 mg/dL — ABNORMAL LOW (ref 6–20)
CHLORIDE: 105 mmol/L (ref 101–111)
CO2: 30 mmol/L (ref 22–32)
Calcium: 8.3 mg/dL — ABNORMAL LOW (ref 8.9–10.3)
Creatinine, Ser: 0.43 mg/dL — ABNORMAL LOW (ref 0.44–1.00)
GFR calc non Af Amer: >60 mL/min (ref >60)
Glucose, Bld: 117 mg/dL — ABNORMAL HIGH (ref 65–99)
POTASSIUM: 3.6 mmol/L (ref 3.5–5.1)
Sodium: 140 mmol/L (ref 135–145)

## 2016-03-15 MED ORDER — CEFAZOLIN (ANCEF) 1 G IV SOLR
1.0000 g | INTRAVENOUS | Status: AC
  Administered 2016-03-16: 1 g
  Filled 2016-03-15: qty 1

## 2016-03-15 NOTE — Progress Notes (Signed)
Subjective:   She started a soft diet today and began to develop abdominal pain once again. It is not as severe and is not associated with any nausea or vomiting. However, there is significant change in her pain with eating solid food. Plain films revealed no evidence of any obstruction and contrast had passed into her decompressed colon. Her white blood cell count is down to 5000.  Vital signs in last 24 hours: Temp:  [97.6 F (36.4 C)-98.1 F (36.7 C)] 97.6 F (36.4 C) (09/21 0444) Pulse Rate:  [65-75] 65 (09/21 1414) Resp:  [17-18] 18 (09/21 0444) BP: (128-153)/(68-83) 153/82 (09/21 1414) SpO2:  [97 %-99 %] 97 % (09/21 1414) Last BM Date: 03/14/16  Intake/Output from previous day: 09/20 0701 - 09/21 0700 In: 984.5 [I.V.:984.5] Out: 1100 [Urine:1100]  Exam:  Her abdomen is soft nontender with no significant guarding no rebound with good bowel sounds.  Lab Results:  CBC  Recent Labs  03/14/16 0717 03/15/16 0531  WBC 12.1* 5.3  HGB 14.0 12.2  HCT 40.5 35.1  PLT 308 252   CMP     Component Value Date/Time   NA 140 03/15/2016 0531   NA 138 09/17/2013 1701   K 3.6 03/15/2016 0531   K 3.5 09/17/2013 1701   CL 105 03/15/2016 0531   CL 104 09/17/2013 1701   CO2 30 03/15/2016 0531   CO2 31 09/17/2013 1701   GLUCOSE 117 (H) 03/15/2016 0531   GLUCOSE 100 (H) 09/17/2013 1701   BUN <5 (L) 03/15/2016 0531   BUN 16 09/17/2013 1701   CREATININE 0.43 (L) 03/15/2016 0531   CREATININE 0.51 (L) 09/17/2013 1701   CALCIUM 8.3 (L) 03/15/2016 0531   CALCIUM 8.9 09/17/2013 1701   PROT 7.9 03/14/2016 0717   PROT 7.4 01/16/2013 1013   ALBUMIN 4.1 03/14/2016 0717   ALBUMIN 3.3 (L) 05/13/2013 0419   AST 21 03/14/2016 0717   AST 15 01/16/2013 1013   ALT 17 03/14/2016 0717   ALT 24 01/16/2013 1013   ALKPHOS 80 03/14/2016 0717   ALKPHOS 104 01/16/2013 1013   BILITOT 0.5 03/14/2016 0717   BILITOT 0.4 01/16/2013 1013   GFRNONAA >60 03/15/2016 0531   GFRNONAA >60 09/17/2013 1701    GFRAA >60 03/15/2016 0531   GFRAA >60 09/17/2013 1701   PT/INR No results for input(s): LABPROT, INR in the last 72 hours.  Studies/Results: Ct Abdomen Pelvis W Contrast  Result Date: 03/14/2016 CLINICAL DATA:  Patient is status post gastric bypass procedure. Lower abdominal pain with nausea and vomiting, acute EXAM: CT ABDOMEN AND PELVIS WITH CONTRAST TECHNIQUE: Multidetector CT imaging of the abdomen and pelvis was performed using the standard protocol following bolus administration of intravenous contrast. Oral contrast was also administered. CONTRAST:  146mL ISOVUE-300 IOPAMIDOL (ISOVUE-300) INJECTION 61% COMPARISON:  June 13, 2011 FINDINGS: Lower chest: Lung bases are clear. Hepatobiliary: No focal liver lesions are identified. Gallbladder wall is not appreciably thickened. There is no biliary duct dilatation. Pancreas: There is no pancreatic mass or inflammatory focus. Spleen: No splenic lesions are evident. Adrenals/Urinary Tract: Adrenals appear unremarkable. Kidneys bilaterally show no evident mass or hydronephrosis on either side. There is no renal or ureteral calculus on either side. Urinary bladder is midline with wall thickness within normal limits. Stomach/Bowel: The patient is status post gastric bypass procedure. There is no wall thickening or fluid in the region of the previous gastric bypass. There are loops of dilated small bowel in the right mid abdomen with a transition zone  just beyond this area of bowel dilatation. There is bowel wall thickening in this area. There is surrounding mesenteric thickening in this area. The appearance is consistent with a developing closed loop obstruction from internal hernia in the right mid abdomen. There is no free air or portal venous air. Bowel elsewhere appears unremarkable. Vascular/Lymphatic: There is no abdominal aortic aneurysm. No vascular lesions are evident on this study. No adenopathy is appreciable in the abdomen or pelvis.  Reproductive: Uterus is anteverted. There is no pelvic mass or pelvic fluid collection. Other: Appendix appears unremarkable. There is no abscess. A small amount of ascites is noted near the liver edge. Musculoskeletal: There are no blastic or lytic bone lesions. There are scattered foci of degenerative change in the lower thoracic and lumbar spine. No intramuscular or abdominal wall lesion. IMPRESSION: Patient is status post gastric bypass procedure. Developing closed loop small-bowel obstruction in the right mid abdomen. There is a transition zone in this area. Contrast does extend through this area. There is wall thickening in the area of the internal hernia/closed loop as well as mesenteric thickening. No free air. Small amount of ascites. No abscess. Appendix region appears normal. No renal or ureteral calculus. No hydronephrosis. Critical Value/emergent results were called by telephone at the time of interpretation on 03/14/2016 at 9:35 am to Dr. Delman Kitten , who verbally acknowledged these results. Critical Value/emergent results were called by telephone at the time of interpretation on 03/14/2016 at 9:35 am to Dr. Delman Kitten , who verbally acknowledged these results. Electronically Signed   By: Lowella Grip III M.D.   On: 03/14/2016 09:35   Dg Abd 2 Views  Result Date: 03/15/2016 CLINICAL DATA:  Pt states she is having a little abdominal discomfort this morning, hx of gastric sleeve 2014. EXAM: ABDOMEN - 2 VIEW COMPARISON:  03/14/2016 FINDINGS: Bowel gas pattern is nonobstructive. Residual contrast is identified within nondilated loops of colon. No evidence for free intraperitoneal air on these supine views. Visualized osseous structures have a normal appearance. IMPRESSION: No evidence for acute  abnormality. Electronically Signed   By: Nolon Nations M.D.   On: 03/15/2016 08:55    Assessment/Plan: With her persistent pain with eating particularly the increasing pain over the course of the day  I'm concerned that she does have a high-grade partial small bowel obstruction. Because it may be partially closed loop she does not have 3 course of decompression. We will going to back are also clear liquids and plan laparoscopy possible laparotomy tomorrow morning for a presumed lysis of adhesions. This plans been discussed with the patient in detail and she is in agreement.

## 2016-03-15 NOTE — Progress Notes (Signed)
Subjective:   She feels much better today although she continues to have some mild abdominal pain. Her white blood cell count is down at her plain films show contrast in her colon with no evidence of any obstruction. Overall she feels improved.  Vital signs in last 24 hours: Temp:  [97.6 F (36.4 C)-98.1 F (36.7 C)] 97.6 F (36.4 C) (09/21 0444) Pulse Rate:  [62-75] 75 (09/21 0444) Resp:  [16-18] 18 (09/21 0444) BP: (123-141)/(60-83) 141/83 (09/21 0444) SpO2:  [98 %-99 %] 99 % (09/21 0444) Last BM Date: 03/14/16  Intake/Output from previous day: 09/20 0701 - 09/21 0700 In: 984.5 [I.V.:984.5] Out: 1100 [Urine:1100]  Exam:  Her abdomen is soft with no significant abdominal tenderness no rebound no guarding. Her lungs are clear bilaterally with no adventitious sounds.  Lab Results:  CBC  Recent Labs  03/14/16 0717 03/15/16 0531  WBC 12.1* 5.3  HGB 14.0 12.2  HCT 40.5 35.1  PLT 308 252   CMP     Component Value Date/Time   NA 140 03/15/2016 0531   NA 138 09/17/2013 1701   K 3.6 03/15/2016 0531   K 3.5 09/17/2013 1701   CL 105 03/15/2016 0531   CL 104 09/17/2013 1701   CO2 30 03/15/2016 0531   CO2 31 09/17/2013 1701   GLUCOSE 117 (H) 03/15/2016 0531   GLUCOSE 100 (H) 09/17/2013 1701   BUN <5 (L) 03/15/2016 0531   BUN 16 09/17/2013 1701   CREATININE 0.43 (L) 03/15/2016 0531   CREATININE 0.51 (L) 09/17/2013 1701   CALCIUM 8.3 (L) 03/15/2016 0531   CALCIUM 8.9 09/17/2013 1701   PROT 7.9 03/14/2016 0717   PROT 7.4 01/16/2013 1013   ALBUMIN 4.1 03/14/2016 0717   ALBUMIN 3.3 (L) 05/13/2013 0419   AST 21 03/14/2016 0717   AST 15 01/16/2013 1013   ALT 17 03/14/2016 0717   ALT 24 01/16/2013 1013   ALKPHOS 80 03/14/2016 0717   ALKPHOS 104 01/16/2013 1013   BILITOT 0.5 03/14/2016 0717   BILITOT 0.4 01/16/2013 1013   GFRNONAA >60 03/15/2016 0531   GFRNONAA >60 09/17/2013 1701   GFRAA >60 03/15/2016 0531   GFRAA >60 09/17/2013 1701   PT/INR No results for  input(s): LABPROT, INR in the last 72 hours.  Studies/Results: Ct Abdomen Pelvis W Contrast  Result Date: 03/14/2016 CLINICAL DATA:  Patient is status post gastric bypass procedure. Lower abdominal pain with nausea and vomiting, acute EXAM: CT ABDOMEN AND PELVIS WITH CONTRAST TECHNIQUE: Multidetector CT imaging of the abdomen and pelvis was performed using the standard protocol following bolus administration of intravenous contrast. Oral contrast was also administered. CONTRAST:  142mL ISOVUE-300 IOPAMIDOL (ISOVUE-300) INJECTION 61% COMPARISON:  June 13, 2011 FINDINGS: Lower chest: Lung bases are clear. Hepatobiliary: No focal liver lesions are identified. Gallbladder wall is not appreciably thickened. There is no biliary duct dilatation. Pancreas: There is no pancreatic mass or inflammatory focus. Spleen: No splenic lesions are evident. Adrenals/Urinary Tract: Adrenals appear unremarkable. Kidneys bilaterally show no evident mass or hydronephrosis on either side. There is no renal or ureteral calculus on either side. Urinary bladder is midline with wall thickness within normal limits. Stomach/Bowel: The patient is status post gastric bypass procedure. There is no wall thickening or fluid in the region of the previous gastric bypass. There are loops of dilated small bowel in the right mid abdomen with a transition zone just beyond this area of bowel dilatation. There is bowel wall thickening in this area. There is surrounding  mesenteric thickening in this area. The appearance is consistent with a developing closed loop obstruction from internal hernia in the right mid abdomen. There is no free air or portal venous air. Bowel elsewhere appears unremarkable. Vascular/Lymphatic: There is no abdominal aortic aneurysm. No vascular lesions are evident on this study. No adenopathy is appreciable in the abdomen or pelvis. Reproductive: Uterus is anteverted. There is no pelvic mass or pelvic fluid collection. Other:  Appendix appears unremarkable. There is no abscess. A small amount of ascites is noted near the liver edge. Musculoskeletal: There are no blastic or lytic bone lesions. There are scattered foci of degenerative change in the lower thoracic and lumbar spine. No intramuscular or abdominal wall lesion. IMPRESSION: Patient is status post gastric bypass procedure. Developing closed loop small-bowel obstruction in the right mid abdomen. There is a transition zone in this area. Contrast does extend through this area. There is wall thickening in the area of the internal hernia/closed loop as well as mesenteric thickening. No free air. Small amount of ascites. No abscess. Appendix region appears normal. No renal or ureteral calculus. No hydronephrosis. Critical Value/emergent results were called by telephone at the time of interpretation on 03/14/2016 at 9:35 am to Dr. Delman Kitten , who verbally acknowledged these results. Critical Value/emergent results were called by telephone at the time of interpretation on 03/14/2016 at 9:35 am to Dr. Delman Kitten , who verbally acknowledged these results. Electronically Signed   By: Lowella Grip III M.D.   On: 03/14/2016 09:35   Dg Abd 2 Views  Result Date: 03/15/2016 CLINICAL DATA:  Pt states she is having a little abdominal discomfort this morning, hx of gastric sleeve 2014. EXAM: ABDOMEN - 2 VIEW COMPARISON:  03/14/2016 FINDINGS: Bowel gas pattern is nonobstructive. Residual contrast is identified within nondilated loops of colon. No evidence for free intraperitoneal air on these supine views. Visualized osseous structures have a normal appearance. IMPRESSION: No evidence for acute  abnormality. Electronically Signed   By: Nolon Nations M.D.   On: 03/15/2016 08:55    Assessment/Plan: We will start her on a diet today and see how she does over the next 24 hours. If she continues to have symptoms we'll consider repeating her CT scan removing surgical intervention. She is in  agreement with this plan.

## 2016-03-16 ENCOUNTER — Observation Stay: Payer: Self-pay | Admitting: Anesthesiology

## 2016-03-16 ENCOUNTER — Encounter
Admission: EM | Disposition: A | Discharge status: Home / Self Care | Payer: Self-pay | Source: Home / Self Care | Attending: Surgery

## 2016-03-16 ENCOUNTER — Encounter: Payer: Self-pay | Admitting: *Deleted

## 2016-03-16 HISTORY — PX: LAPAROSCOPY: SHX197

## 2016-03-16 SURGERY — LAPAROSCOPY, DIAGNOSTIC
Anesthesia: General

## 2016-03-16 MED ORDER — HYDROMORPHONE HCL 1 MG/ML IJ SOLN
1.0000 mg | INTRAMUSCULAR | Status: DC | PRN
  Administered 2016-03-16: 1 mg via INTRAVENOUS
  Filled 2016-03-16 (×2): qty 1

## 2016-03-16 MED ORDER — ROCURONIUM BROMIDE 100 MG/10ML IV SOLN
INTRAVENOUS | Status: DC | PRN
  Administered 2016-03-16 (×2): 20 mg via INTRAVENOUS

## 2016-03-16 MED ORDER — OXYCODONE HCL 5 MG/5ML PO SOLN: 5.0000 mg | Freq: Once | ORAL | Status: DC | PRN

## 2016-03-16 MED ORDER — MORPHINE SULFATE (PF) 2 MG/ML IV SOLN
2.0000 mg | INTRAVENOUS | Status: DC | PRN
  Administered 2016-03-16 – 2016-03-20 (×7): 2 mg via INTRAVENOUS
  Filled 2016-03-16 (×9): qty 1

## 2016-03-16 MED ORDER — ONDANSETRON HCL 4 MG/2ML IJ SOLN
INTRAMUSCULAR | Status: DC | PRN
  Administered 2016-03-16: 4 mg via INTRAVENOUS

## 2016-03-16 MED ORDER — BUPIVACAINE HCL (PF) 0.25 % IJ SOLN
INTRAMUSCULAR | Status: DC | PRN
  Administered 2016-03-16: 30 mL

## 2016-03-16 MED ORDER — FENTANYL CITRATE (PF) 100 MCG/2ML IJ SOLN
25.0000 ug | INTRAMUSCULAR | Status: DC | PRN
  Administered 2016-03-16 (×3): 50 ug via INTRAVENOUS

## 2016-03-16 MED ORDER — SODIUM CHLORIDE 0.9 % IV SOLN
INTRAVENOUS | Status: DC | PRN
  Administered 2016-03-16: 09:00:00 via INTRAVENOUS

## 2016-03-16 MED ORDER — MIDAZOLAM HCL 2 MG/2ML IJ SOLN
INTRAMUSCULAR | Status: DC | PRN
  Administered 2016-03-16: 2 mg via INTRAVENOUS

## 2016-03-16 MED ORDER — BUPIVACAINE HCL (PF) 0.25 % IJ SOLN
INTRAMUSCULAR | Status: AC
  Filled 2016-03-16: qty 30

## 2016-03-16 MED ORDER — LIDOCAINE HCL (CARDIAC) 20 MG/ML IV SOLN
INTRAVENOUS | Status: DC | PRN
  Administered 2016-03-16: 100 mg via INTRAVENOUS

## 2016-03-16 MED ORDER — SUGAMMADEX SODIUM 200 MG/2ML IV SOLN
INTRAVENOUS | Status: DC | PRN
  Administered 2016-03-16: 200 mg via INTRAVENOUS

## 2016-03-16 MED ORDER — FENTANYL CITRATE (PF) 100 MCG/2ML IJ SOLN
INTRAMUSCULAR | Status: AC
  Filled 2016-03-16: qty 4

## 2016-03-16 MED ORDER — OXYCODONE HCL 5 MG PO TABS: 5.0000 mg | ORAL_TABLET | Freq: Once | ORAL | Status: DC | PRN

## 2016-03-16 MED ORDER — FENTANYL CITRATE (PF) 100 MCG/2ML IJ SOLN
INTRAMUSCULAR | Status: DC | PRN
  Administered 2016-03-16: 100 ug via INTRAVENOUS
  Administered 2016-03-16 (×2): 50 ug via INTRAVENOUS
  Administered 2016-03-16: 100 ug via INTRAVENOUS

## 2016-03-16 MED ORDER — HYDROMORPHONE HCL 1 MG/ML IJ SOLN
0.5000 mg | INTRAMUSCULAR | Status: AC | PRN
  Administered 2016-03-16 (×4): 0.5 mg via INTRAVENOUS

## 2016-03-16 MED ORDER — HYDROMORPHONE HCL 1 MG/ML IJ SOLN
INTRAMUSCULAR | Status: AC
  Administered 2016-03-16: 1 mg via INTRAVENOUS
  Filled 2016-03-16: qty 2

## 2016-03-16 MED ORDER — SUCCINYLCHOLINE CHLORIDE 20 MG/ML IJ SOLN
INTRAMUSCULAR | Status: DC | PRN
  Administered 2016-03-16: 100 mg via INTRAVENOUS

## 2016-03-16 MED ORDER — OXYCODONE-ACETAMINOPHEN 5-325 MG PO TABS
1.0000 | ORAL_TABLET | ORAL | Status: DC | PRN
  Administered 2016-03-16 – 2016-03-20 (×12): 2 via ORAL
  Filled 2016-03-16 (×12): qty 2

## 2016-03-16 MED ORDER — DEXAMETHASONE SODIUM PHOSPHATE 10 MG/ML IJ SOLN
INTRAMUSCULAR | Status: DC | PRN
  Administered 2016-03-16: 5 mg via INTRAVENOUS

## 2016-03-16 MED ORDER — EPHEDRINE SULFATE 50 MG/ML IJ SOLN
INTRAMUSCULAR | Status: DC | PRN
  Administered 2016-03-16 (×3): 5 mg via INTRAVENOUS

## 2016-03-16 MED ORDER — PROPOFOL 10 MG/ML IV BOLUS
INTRAVENOUS | Status: DC | PRN
  Administered 2016-03-16: 200 mg via INTRAVENOUS

## 2016-03-16 SURGICAL SUPPLY — 55 items
CANISTER SUCT 1200ML W/VALVE (MISCELLANEOUS) ×3 IMPLANT
CANNULA DILATOR 10 W/SLV (CANNULA) ×2 IMPLANT
CANNULA DILATOR 10MM W/SLV (CANNULA) ×1
CATH TRAY 16F METER LATEX (MISCELLANEOUS) IMPLANT
CHLORAPREP W/TINT 26ML (MISCELLANEOUS) ×3 IMPLANT
CLOSURE WOUND 1/2 X4 (GAUZE/BANDAGES/DRESSINGS) ×2
DRAPE INCISE IOBAN 66X45 STRL (DRAPES) ×3 IMPLANT
DRAPE LAPAROTOMY 100X77 ABD (DRAPES) ×3 IMPLANT
DRAPE STERI IOBAN 125X83 (DRAPES) ×3 IMPLANT
DRSG TEGADERM 2-3/8X2-3/4 SM (GAUZE/BANDAGES/DRESSINGS) ×3 IMPLANT
DRSG TELFA 3X8 NADH (GAUZE/BANDAGES/DRESSINGS) ×3 IMPLANT
ELECT CAUTERY NEEDLE TIP 1.0 (MISCELLANEOUS) ×3
ELECT REM PT RETURN 9FT ADLT (ELECTROSURGICAL) ×3
ELECTRODE CAUTERY NEDL TIP 1.0 (MISCELLANEOUS) ×1 IMPLANT
ELECTRODE REM PT RTRN 9FT ADLT (ELECTROSURGICAL) ×1 IMPLANT
GAUZE SPONGE 4X4 12PLY STRL (GAUZE/BANDAGES/DRESSINGS) ×3 IMPLANT
GLOVE BIO SURGEON STRL SZ7.5 (GLOVE) ×6 IMPLANT
GLOVE INDICATOR 8.0 STRL GRN (GLOVE) ×6 IMPLANT
GOWN STRL REUS W/ TWL LRG LVL3 (GOWN DISPOSABLE) ×4 IMPLANT
GOWN STRL REUS W/TWL LRG LVL3 (GOWN DISPOSABLE) ×8
GRASPER SUT TROCAR 14GX15 (MISCELLANEOUS) ×3 IMPLANT
IRRIGATION STRYKERFLOW (MISCELLANEOUS) ×1 IMPLANT
IRRIGATOR STRYKERFLOW (MISCELLANEOUS) ×3
KIT RM TURNOVER STRD PROC AR (KITS) ×3 IMPLANT
LABEL OR SOLS (LABEL) ×3 IMPLANT
LIGASURE MARYLAND LAP STAND (ELECTROSURGICAL) IMPLANT
NEEDLE FILTER BLUNT 18X 1/2SAF (NEEDLE) ×2
NEEDLE FILTER BLUNT 18X1 1/2 (NEEDLE) ×1 IMPLANT
NEEDLE HYPO 22GX1.5 SAFETY (NEEDLE) ×3 IMPLANT
NS IRRIG 1000ML POUR BTL (IV SOLUTION) ×3 IMPLANT
NS IRRIG 500ML POUR BTL (IV SOLUTION) ×3 IMPLANT
PACK BASIN MAJOR ARMC (MISCELLANEOUS) ×3 IMPLANT
PACK COLON CLEAN CLOSURE (MISCELLANEOUS) IMPLANT
PACK LAP CHOLECYSTECTOMY (MISCELLANEOUS) ×3 IMPLANT
SLEEVE ENDOPATH XCEL 5M (ENDOMECHANICALS) IMPLANT
STAPLER SKIN PROX 35W (STAPLE) ×3 IMPLANT
STRIP CLOSURE SKIN 1/2X4 (GAUZE/BANDAGES/DRESSINGS) ×4 IMPLANT
SUT ETHILON 5-0 FS-2 18 BLK (SUTURE) IMPLANT
SUT MAXON ABS #0 GS21 30IN (SUTURE) IMPLANT
SUT PDS AB 1 TP1 96 (SUTURE) IMPLANT
SUT SILK 3-0 (SUTURE)
SUT SILK 3-0 SH-1 18XCR BRD (SUTURE)
SUT VIC AB 0 CT1 36 (SUTURE) ×3 IMPLANT
SUT VIC AB 0 CT2 27 (SUTURE) ×9 IMPLANT
SUT VIC AB 3-0 SH 27 (SUTURE)
SUT VIC AB 3-0 SH 27X BRD (SUTURE) IMPLANT
SUT VICRYL 0 AB UR-6 (SUTURE) ×3 IMPLANT
SUT VICRYL+ 3-0 144IN (SUTURE) IMPLANT
SUTURE SILK 3-0 SH-1 18XCR BRD (SUTURE) IMPLANT
SYRINGE 10CC LL (SYRINGE) ×3 IMPLANT
TROCAR Z-THREAD FIOS 11X100 BL (TROCAR) ×6 IMPLANT
TROCAR Z-THREAD OPTICAL 5X100M (TROCAR) ×3 IMPLANT
TROCAR Z-THREAD SLEEVE 11X100 (TROCAR) IMPLANT
TUBING INSUFFLATOR HI FLOW (MISCELLANEOUS) ×3 IMPLANT
WATER STERILE IRR 1000ML POUR (IV SOLUTION) ×3 IMPLANT

## 2016-03-16 NOTE — Progress Notes (Signed)
Patient stated she had minimal relief after taking dilaudid, patient stated morphine worked better. Notified MD about patient request, new orders received. Pt ambulated to bathroom to void, resting in bed now. Continue to assess.

## 2016-03-16 NOTE — Anesthesia Postprocedure Evaluation (Signed)
Anesthesia Post Note  Patient: Chloe Reeves  Procedure(s) Performed: Procedure(s) (LRB): LAPAROSCOPY DIAGNOSTIC (N/A)  Patient location during evaluation: PACU Anesthesia Type: General Level of consciousness: awake and alert Pain management: pain level controlled Vital Signs Assessment: post-procedure vital signs reviewed and stable Respiratory status: spontaneous breathing, nonlabored ventilation, respiratory function stable and patient connected to nasal cannula oxygen Cardiovascular status: blood pressure returned to baseline and stable Postop Assessment: no signs of nausea or vomiting Anesthetic complications: no    Last Vitals:  Vitals:   03/16/16 1129 03/16/16 1149  BP:  117/71  Pulse: 60 75  Resp:  18  Temp: 36.6 C 36.9 C    Last Pain:  Vitals:   03/16/16 1149  TempSrc: Oral  PainSc:                  Precious Haws Piscitello

## 2016-03-16 NOTE — Progress Notes (Signed)
Subjective:   She continues to have intermittent pain but certainly at a low level. She's not passing any gas has not had a bowel movement. She is overall stable for surgical intervention today.  Vital signs in last 24 hours: Temp:  [97.7 F (36.5 C)-98.4 F (36.9 C)] 98.4 F (36.9 C) (09/22 0531) Pulse Rate:  [65-71] 71 (09/22 0531) Resp:  [16-18] 17 (09/22 0531) BP: (123-153)/(68-82) 149/68 (09/22 0531) SpO2:  [97 %-99 %] 98 % (09/22 0531) Last BM Date: 03/14/16  Intake/Output from previous day: 09/21 0701 - 09/22 0700 In: [id number] [P.O.:800; I.V.:854; IV Piggyback:50] Out: 2000 [Urine:2000]  Exam:  Her exam is unremarkable and unchanged from yesterday. She has minimal abdominal tenderness.  Lab Results:  CBC  Recent Labs  03/14/16 0717 03/15/16 0531  WBC 12.1* 5.3  HGB 14.0 12.2  HCT 40.5 35.1  PLT 308 252   CMP     Component Value Date/Time   NA 140 03/15/2016 0531   NA 138 09/17/2013 1701   K 3.6 03/15/2016 0531   K 3.5 09/17/2013 1701   CL 105 03/15/2016 0531   CL 104 09/17/2013 1701   CO2 30 03/15/2016 0531   CO2 31 09/17/2013 1701   GLUCOSE 117 (H) 03/15/2016 0531   GLUCOSE 100 (H) 09/17/2013 1701   BUN <5 (L) 03/15/2016 0531   BUN 16 09/17/2013 1701   CREATININE 0.43 (L) 03/15/2016 0531   CREATININE 0.51 (L) 09/17/2013 1701   CALCIUM 8.3 (L) 03/15/2016 0531   CALCIUM 8.9 09/17/2013 1701   PROT 7.9 03/14/2016 0717   PROT 7.4 01/16/2013 1013   ALBUMIN 4.1 03/14/2016 0717   ALBUMIN 3.3 (L) 05/13/2013 0419   AST 21 03/14/2016 0717   AST 15 01/16/2013 1013   ALT 17 03/14/2016 0717   ALT 24 01/16/2013 1013   ALKPHOS 80 03/14/2016 0717   ALKPHOS 104 01/16/2013 1013   BILITOT 0.5 03/14/2016 0717   BILITOT 0.4 01/16/2013 1013   GFRNONAA >60 03/15/2016 0531   GFRNONAA >60 09/17/2013 1701   GFRAA >60 03/15/2016 0531   GFRAA >60 09/17/2013 1701   PT/INR No results for input(s): LABPROT, INR in the last 72 hours.  Studies/Results: Ct Abdomen Pelvis W  Contrast  Result Date: 03/14/2016 CLINICAL DATA:  Patient is status post gastric bypass procedure. Lower abdominal pain with nausea and vomiting, acute EXAM: CT ABDOMEN AND PELVIS WITH CONTRAST TECHNIQUE: Multidetector CT imaging of the abdomen and pelvis was performed using the standard protocol following bolus administration of intravenous contrast. Oral contrast was also administered. CONTRAST:  168mL ISOVUE-300 IOPAMIDOL (ISOVUE-300) INJECTION 61% COMPARISON:  June 13, 2011 FINDINGS: Lower chest: Lung bases are clear. Hepatobiliary: No focal liver lesions are identified. Gallbladder wall is not appreciably thickened. There is no biliary duct dilatation. Pancreas: There is no pancreatic mass or inflammatory focus. Spleen: No splenic lesions are evident. Adrenals/Urinary Tract: Adrenals appear unremarkable. Kidneys bilaterally show no evident mass or hydronephrosis on either side. There is no renal or ureteral calculus on either side. Urinary bladder is midline with wall thickness within normal limits. Stomach/Bowel: The patient is status post gastric bypass procedure. There is no wall thickening or fluid in the region of the previous gastric bypass. There are loops of dilated small bowel in the right mid abdomen with a transition zone just beyond this area of bowel dilatation. There is bowel wall thickening in this area. There is surrounding mesenteric thickening in this area. The appearance is consistent with a developing closed loop obstruction  from internal hernia in the right mid abdomen. There is no free air or portal venous air. Bowel elsewhere appears unremarkable. Vascular/Lymphatic: There is no abdominal aortic aneurysm. No vascular lesions are evident on this study. No adenopathy is appreciable in the abdomen or pelvis. Reproductive: Uterus is anteverted. There is no pelvic mass or pelvic fluid collection. Other: Appendix appears unremarkable. There is no abscess. A small amount of ascites is  noted near the liver edge. Musculoskeletal: There are no blastic or lytic bone lesions. There are scattered foci of degenerative change in the lower thoracic and lumbar spine. No intramuscular or abdominal wall lesion. IMPRESSION: Patient is status post gastric bypass procedure. Developing closed loop small-bowel obstruction in the right mid abdomen. There is a transition zone in this area. Contrast does extend through this area. There is wall thickening in the area of the internal hernia/closed loop as well as mesenteric thickening. No free air. Small amount of ascites. No abscess. Appendix region appears normal. No renal or ureteral calculus. No hydronephrosis. Critical Value/emergent results were called by telephone at the time of interpretation on 03/14/2016 at 9:35 am to Dr. Delman Kitten , who verbally acknowledged these results. Critical Value/emergent results were called by telephone at the time of interpretation on 03/14/2016 at 9:35 am to Dr. Delman Kitten , who verbally acknowledged these results. Electronically Signed   By: Lowella Grip III M.D.   On: 03/14/2016 09:35   Dg Abd 2 Views  Result Date: 03/15/2016 CLINICAL DATA:  Pt states she is having a little abdominal discomfort this morning, hx of gastric sleeve 2014. EXAM: ABDOMEN - 2 VIEW COMPARISON:  03/14/2016 FINDINGS: Bowel gas pattern is nonobstructive. Residual contrast is identified within nondilated loops of colon. No evidence for free intraperitoneal air on these supine views. Visualized osseous structures have a normal appearance. IMPRESSION: No evidence for acute  abnormality. Electronically Signed   By: Nolon Nations M.D.   On: 03/15/2016 08:55    Assessment/Plan: At this point I think we have good evidence that she continues to have some sort of low-grade partial bowel obstruction possibly a partial closed loop obstruction. We will plan laparoscopy possible laparotomy later today. She is in agreement.

## 2016-03-16 NOTE — Anesthesia Preprocedure Evaluation (Signed)
Anesthesia Evaluation  Patient identified by MRN, date of birth, ID band Patient awake    Reviewed: Allergy & Precautions, H&P , NPO status , Patient's Chart, lab work & pertinent test results  History of Anesthesia Complications Negative for: history of anesthetic complications  Airway Mallampati: III  TM Distance: >3 FB Neck ROM: full    Dental no notable dental hx. 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 Ernst Breach 2014: [place] N/A  BMI    Body Mass Index:  41.15 kg/m      Reproductive/Obstetrics negative OB ROS                             Anesthesia Physical Anesthesia Plan  ASA: III  Anesthesia Plan: General ETT   Post-op Pain Management:    Induction:   Airway Management Planned:   Additional Equipment:   Intra-op Plan:   Post-operative Plan:    Informed Consent: I have reviewed the patients History and Physical, chart, labs and discussed the procedure including the risks, benefits and alternatives for the proposed anesthesia with the patient or authorized representative who has indicated his/her understanding and acceptance.     Plan Discussed with: Anesthesiologist, CRNA and Surgeon  Anesthesia Plan Comments:         Anesthesia Quick Evaluation

## 2016-03-16 NOTE — Transfer of Care (Signed)
Immediate Anesthesia Transfer of Care Note  Patient: Chloe Reeves  Procedure(s) Performed: Procedure(s): LAPAROSCOPY DIAGNOSTIC (N/A)  Patient Location: PACU  Anesthesia Type:General  Level of Consciousness: awake  Airway & Oxygen Therapy: Patient connected to nasal cannula oxygen  Post-op Assessment: Report given to RN  Post vital signs: stable  Last Vitals:  Vitals:   03/16/16 0813 03/16/16 1052  BP: (!) 157/94 134/80  Pulse: 66 88  Resp: 16 13  Temp: (!) 36 C 36.6 C    Last Pain:  Vitals:   03/16/16 0813  TempSrc: Oral  PainSc: 3       Patients Stated Pain Goal: 1 ([id number])  Complications: No apparent anesthesia complications

## 2016-03-16 NOTE — Op Note (Signed)
03/14/2016 - 03/16/2016  10:41 AM  PATIENT:  Chloe Reeves  45 y.o. female  PRE-OPERATIVE DIAGNOSIS:  SBO  POST-OPERATIVE DIAGNOSIS:  * No post-op diagnosis entered *  PROCEDURE:  Procedure(s): LAPAROSCOPY DIAGNOSTIC (N/A) EXPLORATORY LAPAROTOMY (N/A)  SURGEON:  Surgeon(s) and Role:    * Dia Crawford III, MD - Primary   ASSISTANTS: none   ANESTHESIA:   general  EBL:  Total I/O In: 250 [I.V.:250] Out: 5 [Blood:5]   DRAINS: none   LOCAL MEDICATIONS USED:  MARCAINE      DISPOSITION OF SPECIMEN:  N/A   DICTATION: .Dragon Dictation  with the patient supine position after induction appropriate general anesthesia the patient was prepped ChloraPrep and draped in sterile towels. The patient was identified to position. A small infraumbilical incision was made in the standard fashion carried down bluntly through subcutaneous tissue. All Flores needle was utilized to cannulate. He'll cavity. CO2 was then insufflated. An oxygen cannula was utilized to enter the abdominal cavity. Intraperitoneal position was again confirmed CO2 were insufflated.  A left upper quadrant and suprapubic ports were placed under direct vision. The bowel was then examined. There was some size mismatch from the proximal about the distal bowel. The colon what I could see of the stomach liver and gallbladder all of normal. The bowel was then identified at the ileocecal valve and examined all the way to the ligament of Treitz. There was some mildly erythematous bilateral but no evidence of any obstruction no adhesions and no ischemia.  A right lower quadrant transverse incision was made and another 4 inserted and the bowel was then run in the opposite direction from the ligament of Treitz to the terminal ileum. Again no significant abnormalities were identified. I cannot identify site of obstruction or any evidence of a closed loop obstruction. The decision was made to not pursue laparoscopy sister do not appear to be  any significant abnormality in the small bowel. The incisions were all closed with 0 Vicryl using the direct visualization technique for the suture passer. Then was desufflated. Skin incisions were closed with 5-0 nylon. There was infiltrated with 0.25% Marcaine for postoperative pain control. Sterile dressings were applied. The patient returned recovery room having tolerated the procedure well. Sponge instrument needle count correct 2 in the operating.   PLAN OF CARE: Admit to inpatient   PATIENT DISPOSITION:  PACU - hemodynamically stable.   Dia Crawford III, MD

## 2016-03-16 NOTE — Anesthesia Procedure Notes (Signed)
Procedure Name: Intubation Date/Time: 03/16/2016 9:09 AM Performed by: Silvana Newness Pre-anesthesia Checklist: Patient identified, Emergency Drugs available, Suction available, Patient being monitored and Timeout performed Patient Re-evaluated:Patient Re-evaluated prior to inductionOxygen Delivery Method: Circle system utilized Preoxygenation: Pre-oxygenation with 100% oxygen Intubation Type: IV induction, Rapid sequence and Cricoid Pressure applied Laryngoscope Size: Mac and 3 Grade View: Grade I Tube type: Oral Tube size: 7.0 mm Number of attempts: 1 Airway Equipment and Method: Stylet Placement Confirmation: ETT inserted through vocal cords under direct vision,  positive ETCO2 and breath sounds checked- equal and bilateral Secured at: 19 cm Tube secured with: Tape Dental Injury: Teeth and Oropharynx as per pre-operative assessment

## 2016-03-17 LAB — CBC
HCT: 33.6 % — ABNORMAL LOW (ref 35.0–47.0)
Hemoglobin: 11.9 g/dL — ABNORMAL LOW (ref 12.0–16.0)
MCH: 32.6 pg (ref 26.0–34.0)
MCHC: 35.4 g/dL (ref 32.0–36.0)
MCV: 92.2 fL (ref 80.0–100.0)
PLATELETS: 234 10*3/uL (ref 150–440)
RBC: 3.65 MIL/uL — AB (ref 3.80–5.20)
RDW: 13.4 % (ref 11.5–14.5)
WBC: 6.4 10*3/uL (ref 3.6–11.0)

## 2016-03-17 MED ORDER — ONDANSETRON HCL 4 MG/2ML IJ SOLN
4.0000 mg | Freq: Four times a day (QID) | INTRAMUSCULAR | Status: DC | PRN
  Administered 2016-03-17 – 2016-03-19 (×2): 4 mg via INTRAVENOUS
  Filled 2016-03-17 (×2): qty 2

## 2016-03-17 NOTE — Progress Notes (Signed)
1 Day Post-Op   Subjective:  45 year old female is one day status post export her laparoscopy. Patient reports having pain at the operative sites and feeling mildly distended this morning. She denies any nausea or vomiting. She's not had a bowel movement in the last several days.  Vital signs in last 24 hours: Temp:  [97.8 F (36.6 C)-98.6 F (37 C)] 98.6 F (37 C) (09/23 0607) Pulse Rate:  [60-88] 75 (09/23 0607) Resp:  [13-26] 20 (09/23 0607) BP: (117-150)/(68-85) 150/85 (09/23 0607) SpO2:  [89 %-100 %] 100 % (09/23 0607) Last BM Date: 03/14/16  Intake/Output from previous day: 09/22 0701 - 09/23 0700 In: 1301.5 [I.V.:1301.5] Out: 1705 [Urine:1700; Blood:5]  GI: Abdomen is soft, purple tender to palpation of the incision sites, mildly distended. No evidence of erythema or spreading infection from the operative sites.  Lab Results:  CBC  Recent Labs  03/15/16 0531 03/17/16 0545  WBC 5.3 6.4  HGB 12.2 11.9*  HCT 35.1 33.6*  PLT 252 234   CMP     Component Value Date/Time   NA 140 03/15/2016 0531   NA 138 09/17/2013 1701   K 3.6 03/15/2016 0531   K 3.5 09/17/2013 1701   CL 105 03/15/2016 0531   CL 104 09/17/2013 1701   CO2 30 03/15/2016 0531   CO2 31 09/17/2013 1701   GLUCOSE 117 (H) 03/15/2016 0531   GLUCOSE 100 (H) 09/17/2013 1701   BUN <5 (L) 03/15/2016 0531   BUN 16 09/17/2013 1701   CREATININE 0.43 (L) 03/15/2016 0531   CREATININE 0.51 (L) 09/17/2013 1701   CALCIUM 8.3 (L) 03/15/2016 0531   CALCIUM 8.9 09/17/2013 1701   PROT 7.9 03/14/2016 0717   PROT 7.4 01/16/2013 1013   ALBUMIN 4.1 03/14/2016 0717   ALBUMIN 3.3 (L) 05/13/2013 0419   AST 21 03/14/2016 0717   AST 15 01/16/2013 1013   ALT 17 03/14/2016 0717   ALT 24 01/16/2013 1013   ALKPHOS 80 03/14/2016 0717   ALKPHOS 104 01/16/2013 1013   BILITOT 0.5 03/14/2016 0717   BILITOT 0.4 01/16/2013 1013   GFRNONAA >60 03/15/2016 0531   GFRNONAA >60 09/17/2013 1701   GFRAA >60 03/15/2016 0531   GFRAA  >60 09/17/2013 1701   PT/INR No results for input(s): LABPROT, INR in the last 72 hours.  Studies/Results: No results found.  Assessment/Plan: 45 year old female status post diagnostic laparoscopy for abdominal pain thought to be secondary to possible intra-abdominal hernia. The operation was negative for any pathology. Discussed with the patient other possible causes for her postprandial abdominal pain. We will obtain an inpatient GI consult to help evaluate for other possible causes including peptic ulcer disease. Encourage ambulation, and sinus primary usage, oral intake.   Clayburn Pert, MD FACS General Surgeon  03/17/2016

## 2016-03-17 NOTE — Plan of Care (Signed)
Problem: Activity: Goal: Risk for activity intolerance will decrease Outcome: Completed/Met Date Met: 03/17/16 Pt has ambulated multiple times around nurse's station today; tolerated well each time

## 2016-03-18 NOTE — Progress Notes (Signed)
03/18/2016  Subjective: Patient is postop date #2 status post exploratory laparoscopy. The patient was able to tolerate clears and reports that this morning she started passing flatus. Her abdominal pain is well-controlled. She had 1 episode of nausea yesterday morning while ambulating but has otherwise not had any problems since.  Vital signs: Temp:  [98.1 F (36.7 C)-98.8 F (37.1 C)] 98.7 F (37.1 C) (09/24 1228) Pulse Rate:  [68-79] 76 (09/24 1228) Resp:  [20] 20 (09/24 1228) BP: (142-144)/(87-96) 142/88 (09/24 1228) SpO2:  [98 %-99 %] 99 % (09/24 1228)   Intake/Output: 09/23 0701 - 09/24 0700 In: 1111.7 [I.V.:1061.7; IV Piggyback:50] Out: 1600 [Urine:1600] Last BM Date: 03/14/16  Physical Exam: Constitutional: No acute distress Cardiac:  Regular rhythm and rate Pulm: Lungs are clear bilaterally with no respiratory distress Abdomen: Soft nondistended appropriately tender to palpation. All incisions are clean dry and intact with no evidence of infection  Labs:   Recent Labs  03/17/16 0545  WBC 6.4  HGB 11.9*  HCT 33.6*  PLT 234   No results for input(s): NA, K, CL, CO2, GLUCOSE, BUN, CREATININE, CALCIUM in the last 72 hours.  Invalid input(s): MAGNESIUM No results for input(s): LABPROT, INR in the last 72 hours.  Imaging: No results found.  Assessment/Plan: 45 year old female postoperative day #2 from exploratory laparoscopy for bowel obstruction. Currently the patient is doing well and started passing flatus and tolerated her clear liquid diet yesterday.  -We'll advance the patient today to a full liquid diet.  -We'll discontinue IV fluids today -continue ambulation -Awaiting GI consult.   Melvyn Neth, [place]

## 2016-03-19 ENCOUNTER — Encounter: Payer: Self-pay | Admitting: Surgery

## 2016-03-19 DIAGNOSIS — R110 Nausea: Secondary | ICD-10-CM

## 2016-03-19 DIAGNOSIS — K56609 Unspecified intestinal obstruction, unspecified as to partial versus complete obstruction: Secondary | ICD-10-CM

## 2016-03-19 LAB — CREATININE, SERUM: CREATININE: 0.48 mg/dL (ref 0.44–1.00)

## 2016-03-19 NOTE — Progress Notes (Signed)
POD # 3 s/p dx lap, no evidence of SBO Doing ok Still has intermittent abd pain Passing gas Taking PO , no emesis AVSS  PE NAD Abd: soft, incisions c/d/i, no infection mild diffuse abd pain, no peritontis  A/P abd pain no surgical pathology Pending GI consult Advance diet Hopefully DC sooon mobilize

## 2016-03-19 NOTE — Consult Note (Signed)
Lucilla Lame, MD [address] [place], [place] [address] Phone: [phone] Fax : [phone]  Consultation  Referring Provider:     No ref. provider found Primary Care Physician:  Perrin Maltese, MD Primary Gastroenterologist:           Reason for Consultation:     Abdominal pain  Date of Admission:  03/14/2016 Date of Consultation:  03/19/2016         HPI:   Chloe Reeves is a 45 y.o. female who has a history of a weight loss surgery and came in with abdominal pain and a CT scan suggestive of a small bowel obstruction. The patient underwent a exploratory laparoscopy.  No hernia or obstruction was seen.  The patient has now been tolerating clear liquids.  She now reports that her abdominal pain is still present not as bad as it was when she was admitted.  She also had an episode of nausea yesterday. The patient had a slightly elevated white count on admission.  The patient denies any fevers or chills but did have vomiting and was sweaty when the pain started.  I am now being asked to see the patient for her continued abdominal pain.  Past Medical History:  Diagnosis Date  . 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 Diabetes mellitus without complication ([place])   . Heel spur   . Hypertension 2013  . Lump or mass in breast   . Morbid obesity ([place])   . Personal history of tobacco use, presenting hazards to health   . Plantar fasciitis   . Sleep apnea   . Special screening for malignant neoplasms, colon     Past Surgical History:  Procedure Laterality Date  . COLONOSCOPY  2012   Dr. Ernst Breach  . LAPAROSCOPIC GASTRIC SLEEVE RESECTION N/A 2014  . LAPAROSCOPY N/A 03/16/2016   Procedure: LAPAROSCOPY DIAGNOSTIC;  Surgeon: Dia Crawford III, MD;  Location: [facility] ORS;  Service: General;  Laterality: N/A;    Prior to Admission medications   Medication Sig Start Date End Date Taking? Authorizing Provider  acetaminophen (TYLENOL) 325 MG tablet Take 650 mg by  mouth every 6 (six) hours as needed.   Yes Historical Provider, MD    Family History  Problem Relation Age of Onset  . Cancer Other     pt is unsure of details regarding FH of cancer     Social History  Substance Use Topics  . Smoking status: Former Research scientist (life sciences)  . Smokeless tobacco: Never Used  . Alcohol use Yes     Comment: "occassional"    Allergies as of 03/14/2016 - Review Complete 03/14/2016  Allergen Reaction Noted  . Scopolamine Other (See Comments) 01/22/2015  . Benadryl [diphenhydramine hcl] Rash 11/03/2012    Review of Systems:    All systems reviewed and negative except where noted in HPI.   Physical Exam:  Vital signs in last 24 hours: Temp:  [98 F (36.7 C)-98.2 F (36.8 C)] 98.1 F (36.7 C) (09/25 2036) Pulse Rate:  [63-74] 63 (09/25 2036) Resp:  [12-20] 18 (09/25 2036) BP: (128-156)/(73-92) 128/73 (09/25 2036) SpO2:  [95 %-99 %] 96 % (09/25 2036) Last BM Date: 03/14/16 General:   Pleasant, cooperative in NAD Head:  Normocephalic and atraumatic. Eyes:   No icterus.   Conjunctiva pink. PERRLA. Ears:  Normal auditory acuity. Neck:  Supple; no masses or thyroidomegaly Lungs: Respirations even and unlabored. Lungs clear to auscultation bilaterally.   No wheezes, crackles,  or rhonchi.  Heart:  Regular rate and rhythm;  Without murmur, clicks, rubs or gallops Abdomen:  Soft, nondistended, Mildly tender. Normal bowel sounds. No appreciable masses or hepatomegaly.  No rebound or guarding.  Rectal:  Not performed. Msk:  Symmetrical without gross deformities.    Extremities:  Without edema, cyanosis or clubbing. Neurologic:  Alert and oriented x3;  grossly normal neurologically. Skin:  Intact without significant lesions or rashes. Cervical Nodes:  No significant cervical adenopathy. Psych:  Alert and cooperative. Normal affect.  LAB RESULTS:  Recent Labs  03/17/16 0545  WBC 6.4  HGB 11.9*  HCT 33.6*  PLT 234   BMET  Recent Labs  03/19/16 0531    CREATININE 0.48   LFT No results for input(s): PROT, ALBUMIN, AST, ALT, ALKPHOS, BILITOT, BILIDIR, IBILI in the last 72 hours. PT/INR No results for input(s): LABPROT, INR in the last 72 hours.  STUDIES: No results found.    Impression / Plan:   Chloe Reeves is a 45 y.o. y/o female with admission to the hospital for abdominal pain with nausea resulting in exploratory laparoscopy.  The patient did not have any findings to explain her symptoms.  The patient will be set up for an EGD for tomorrow due to her continued pain and recent nausea. I have discussed risks & benefits which include, but are not limited to, bleeding, infection, perforation & drug reaction.  The patient agrees with this plan & written consent will be obtained.      Thank you for involving me in the care of this patient.      LOS: 0 days   Lucilla Lame, MD  03/19/2016, 8:46 PM   Note: This dictation was prepared with Dragon dictation along with smaller phrase technology. Any transcriptional errors that result from this process are unintentional.

## 2016-03-20 ENCOUNTER — Observation Stay: Payer: Self-pay | Admitting: Anesthesiology

## 2016-03-20 ENCOUNTER — Encounter: Payer: Self-pay | Admitting: Anesthesiology

## 2016-03-20 ENCOUNTER — Encounter
Admission: EM | Disposition: A | Discharge status: Home / Self Care | Payer: Self-pay | Source: Home / Self Care | Attending: Surgery

## 2016-03-20 DIAGNOSIS — R1084 Generalized abdominal pain: Secondary | ICD-10-CM

## 2016-03-20 DIAGNOSIS — Z9884 Bariatric surgery status: Secondary | ICD-10-CM

## 2016-03-20 HISTORY — PX: ESOPHAGOGASTRODUODENOSCOPY (EGD) WITH PROPOFOL: SHX5813

## 2016-03-20 SURGERY — ESOPHAGOGASTRODUODENOSCOPY (EGD) WITH PROPOFOL
Anesthesia: General

## 2016-03-20 MED ORDER — MIDAZOLAM HCL 5 MG/5ML IJ SOLN
INTRAMUSCULAR | Status: DC | PRN
  Administered 2016-03-20: 1 mg via INTRAVENOUS

## 2016-03-20 MED ORDER — PROPOFOL 10 MG/ML IV BOLUS
INTRAVENOUS | Status: DC | PRN
  Administered 2016-03-20: 100 mg via INTRAVENOUS

## 2016-03-20 MED ORDER — ONDANSETRON HCL 4 MG PO TABS: 4.0000 mg | ORAL_TABLET | Freq: Three times a day (TID) | ORAL | 0 refills | Status: DC | PRN

## 2016-03-20 MED ORDER — ONDANSETRON HCL 4 MG/2ML IJ SOLN: 4.0000 mg | Freq: Once | INTRAMUSCULAR | Status: DC | PRN

## 2016-03-20 MED ORDER — FENTANYL CITRATE (PF) 100 MCG/2ML IJ SOLN
INTRAMUSCULAR | Status: AC
  Administered 2016-03-20: 100 ug via INTRAVENOUS
  Filled 2016-03-20: qty 2

## 2016-03-20 MED ORDER — GABAPENTIN 300 MG PO CAPS: 300.0000 mg | ORAL_CAPSULE | Freq: Three times a day (TID) | ORAL | 0 refills | Status: DC

## 2016-03-20 MED ORDER — PROPOFOL 500 MG/50ML IV EMUL
INTRAVENOUS | Status: DC | PRN
  Administered 2016-03-20: 180 ug/kg/min via INTRAVENOUS

## 2016-03-20 MED ORDER — FENTANYL CITRATE (PF) 100 MCG/2ML IJ SOLN
25.0000 ug | INTRAMUSCULAR | Status: DC | PRN
  Administered 2016-03-20: 100 ug via INTRAVENOUS

## 2016-03-20 MED ORDER — FENTANYL CITRATE (PF) 100 MCG/2ML IJ SOLN
INTRAMUSCULAR | Status: DC | PRN
  Administered 2016-03-20: 50 ug via INTRAVENOUS

## 2016-03-20 MED ORDER — OMEPRAZOLE 40 MG PO CPDR: 40.0000 mg | DELAYED_RELEASE_CAPSULE | Freq: Every day | ORAL | 0 refills | Status: DC

## 2016-03-20 MED ORDER — LIDOCAINE 2% (20 MG/ML) 5 ML SYRINGE
INTRAMUSCULAR | Status: DC | PRN
  Administered 2016-03-20: 40 mg via INTRAVENOUS

## 2016-03-20 MED ORDER — SODIUM CHLORIDE 0.9 % IV SOLN
INTRAVENOUS | Status: DC
  Administered 2016-03-20: 1000 mL via INTRAVENOUS

## 2016-03-20 MED ORDER — OXYCODONE-ACETAMINOPHEN 7.5-325 MG PO TABS: 1.0000 | ORAL_TABLET | ORAL | 0 refills | Status: DC | PRN

## 2016-03-20 NOTE — Op Note (Signed)
[facility] Gastroenterology Patient Name: Chloe Reeves Procedure Date: 03/20/2016 7:30 AM MRN: [id number] Account #: [id number] Date of Birth: Apr 27, 1971 Admit Type: Inpatient Age: 45 Room: [facility] ENDO ROOM 4 Gender: Female Note Status: Finalized Procedure:            Upper GI endoscopy Indications:          Generalized abdominal pain Providers:            Lucilla Lame MD, MD Referring MD:         Perrin Maltese, MD (Referring MD) Medicines:            Propofol per Anesthesia Complications:        No immediate complications. Procedure:            Pre-Anesthesia Assessment:                       - Prior to the procedure, a History and Physical was                        performed, and patient medications and allergies were                        reviewed. The patient's tolerance of previous                        anesthesia was also reviewed. The risks and benefits of                        the procedure and the sedation options and risks were                        discussed with the patient. All questions were                        answered, and informed consent was obtained. Prior                        Anticoagulants: The patient has taken no previous                        anticoagulant or antiplatelet agents. ASA Grade                        Assessment: II - A patient with mild systemic disease.                        After reviewing the risks and benefits, the patient was                        deemed in satisfactory condition to undergo the                        procedure.                       After obtaining informed consent, the endoscope was                        passed under direct vision. Throughout the procedure,  the patient's blood pressure, pulse, and oxygen                        saturations were monitored continuously. The Endoscope                        was introduced through the mouth, and advanced to the             second part of duodenum. The upper GI endoscopy was                        accomplished without difficulty. The patient tolerated                        the procedure well. Findings:      The examined esophagus was normal.      Evidence of a sleeve gastrectomy was found in the gastric body. This was       characterized by healthy appearing mucosa.      The examined duodenum was normal. Impression:           - Normal esophagus.                       - A sleeve gastrectomy was found, characterized by                        healthy appearing mucosa.                       - Normal examined duodenum.                       - No specimens collected. Recommendation:       - Return patient to hospital ward for ongoing care.                       - Resume previous diet. Procedure Code(s):    --- Professional ---                       [phone], Esophagogastroduodenoscopy, flexible, transoral;                        diagnostic, including collection of specimen(s) by                        brushing or washing, when performed (separate procedure) Diagnosis Code(s):    --- Professional ---                       R10.84, Generalized abdominal pain                       Z98.84, Bariatric surgery status CPT copyright 2016 American Medical Association. All rights reserved. The codes documented in this report are preliminary and upon coder review may  be revised to meet current compliance requirements. Lucilla Lame MD, MD 03/20/2016 7:44:39 AM This report has been signed electronically. Number of Addenda: 0 Note Initiated On: 03/20/2016 7:30 AM      [facility]

## 2016-03-20 NOTE — Transfer of Care (Signed)
Immediate Anesthesia Transfer of Care Note  Patient: Chloe Reeves  Procedure(s) Performed: Procedure(s): ESOPHAGOGASTRODUODENOSCOPY (EGD) WITH PROPOFOL (N/A)  Patient Location: PACU and Endoscopy Unit  Anesthesia Type:General  Level of Consciousness: sedated  Airway & Oxygen Therapy: Patient Spontanous Breathing and Patient connected to nasal cannula oxygen  Post-op Assessment: Report given to RN and Post -op Vital signs reviewed and stable  Post vital signs: Reviewed and stable  Last Vitals:  Vitals:   03/19/16 2036 03/20/16 0451  BP: 128/73 (!) 147/85  Pulse: 63 66  Resp: 18 18  Temp: 36.7 C 36.7 C    Last Pain:  Vitals:   03/20/16 0451  TempSrc: Oral  PainSc:       Patients Stated Pain Goal: 1 ([id number])  Complications: No apparent anesthesia complications

## 2016-03-20 NOTE — Anesthesia Preprocedure Evaluation (Signed)
Anesthesia Evaluation  Patient identified by MRN, date of birth, ID band Patient awake    Reviewed: Allergy & Precautions, NPO status , Patient's Chart, lab work & pertinent test results  Airway Mallampati: III  TM Distance: >3 FB     Dental  (+) Chipped   Pulmonary sleep apnea and Continuous Positive Airway Pressure Ventilation , former smoker,    Pulmonary exam normal        Cardiovascular hypertension, Normal cardiovascular exam     Neuro/Psych negative neurological ROS     GI/Hepatic negative GI ROS, Neg liver ROS,   Endo/Other  diabetes  Renal/GU negative Renal ROS  negative genitourinary   Musculoskeletal negative musculoskeletal ROS (+)   Abdominal Normal abdominal exam  (+)   Peds negative pediatric ROS (+)  Hematology negative hematology ROS (+)   Anesthesia Other Findings   Reproductive/Obstetrics                             Anesthesia Physical Anesthesia Plan  ASA: III  Anesthesia Plan: General   Post-op Pain Management:    Induction: Intravenous  Airway Management Planned: Nasal Cannula  Additional Equipment:   Intra-op Plan:   Post-operative Plan:   Informed Consent: I have reviewed the patients History and Physical, chart, labs and discussed the procedure including the risks, benefits and alternatives for the proposed anesthesia with the patient or authorized representative who has indicated his/her understanding and acceptance.   Dental advisory given  Plan Discussed with: CRNA and Surgeon  Anesthesia Plan Comments:         Anesthesia Quick Evaluation

## 2016-03-20 NOTE — Discharge Summary (Signed)
Patient ID: Chloe Reeves MRN: [id number] DOB/AGE: May 09, 1971 45 y.o.  Admit date: 03/14/2016 Discharge date: 03/20/2016   Discharge Diagnoses:  Active Problems:   SBO (small bowel obstruction) (HCC)   Small bowel obstruction (HCC)   Nausea without vomiting   Abdominal pain, generalized   Bariatric surgery status   Procedures: Diagnostic laparoscopy performed by Dr. Pat Patrick on 9/22  Hospital Course: Is a 45 year old obese female admitted with abdominal pain and nausea and vomiting. Revealing a bowel obstruction with a questionable closed loop obstruction. He saw her initially in the emergency room and her abdomen was soft and benign there was no need for immediate surgical intervention she was kept nothing by mouth and watched overnight. Unfortunately her pain persisted for a couple of days and at this point Dr. Pat Patrick decided to go to the operating room for a diagnostic laparoscopy to rule out any complete obstruction. Her diagnostic laparoscopy was unremarkable there was no evidence of bowel obstruction or any abdominal pathology. Her postoperative course was benign but she is still complaining of the abdominal pain and nausea. Because of this recent GI consult was obtained and an EGD was performed showing a normal EGD. She did have a history of a laparoscopic gastric sleeve resection but there was no evidence of any issues on endoscopic findings. I had a lengthy discussion with the patient about the possibility etiologies of her abdominal pain that included anywhere from IBS to reflux disease to some microscopic colitis. Certainly no evidence of acute abdominal pathology or any pathology that mandates any surgical intervention. We'll treat her symptomatically with and Zofran when necessary and will add gabapentin for adjuvant pain management. We'll prescribe pressure, course of narcotics given that she did have a diagnostic laparoscopy and may benefit from a short course of Percocet. We will continue  to follow her as an outpatient basis and encouraged her to also get an appointment with her primary care physician so for her workup her source of the GI complaints.  Consults: GI  Disposition: 01-Home or Self Care  Discharge Instructions    Call MD for:  difficulty breathing, headache or visual disturbances    Complete by:  As directed    Call MD for:  extreme fatigue    Complete by:  As directed    Call MD for:  hives    Complete by:  As directed    Call MD for:  persistant dizziness or light-headedness    Complete by:  As directed    Call MD for:  persistant nausea and vomiting    Complete by:  As directed    Call MD for:  redness, tenderness, or signs of infection (pain, swelling, redness, odor or green/yellow discharge around incision site)    Complete by:  As directed    Call MD for:  severe uncontrolled pain    Complete by:  As directed    Call MD for:  temperature >100.4    Complete by:  As directed    Diet - low sodium heart healthy    Complete by:  As directed    Discharge instructions    Complete by:  As directed    Shower daily   Increase activity slowly    Complete by:  As directed    Lifting restrictions    Complete by:  As directed    20 lbs   Other Restrictions    Complete by:  As directed    Please provide work excuse . Pt unable  to lift more than 20 lbs x 6 weeks       Medication List    TAKE these medications   acetaminophen 325 MG tablet Commonly known as:  TYLENOL Take 650 mg by mouth every 6 (six) hours as needed.   gabapentin 300 MG capsule Commonly known as:  NEURONTIN Take 1 capsule (300 mg total) by mouth 3 (three) times daily.   omeprazole 40 MG capsule Commonly known as:  PRILOSEC Take 1 capsule (40 mg total) by mouth daily.   ondansetron 4 MG tablet Commonly known as:  ZOFRAN Take 1 tablet (4 mg total) by mouth every 8 (eight) hours as needed for nausea or vomiting.   oxyCODONE-acetaminophen 7.5-325 MG tablet Commonly known as:   PERCOCET Take 1 tablet by mouth every 4 (four) hours as needed for severe pain.      Follow-up Information    Jules Husbands, MD. Go on 03/30/2016.   Specialty:  General Surgery Why:  Firday at 9:15am for hospital follow-up. Contact information: [place] [address] [phone]        Lucilla Lame, MD. Go on 04/12/2016.   Specialty:  Gastroenterology Why:  Thursday at 4:15pm hospital follow-up Contact information: [place] [address] [phone]            Caroleen Hamman, MD FACS

## 2016-03-20 NOTE — Care Management (Signed)
Patient to discharge home today.  Patient is employed, however is uninsured.  Patient patient to discharge on 4 new prescriptions.  I have provided the patient with a coupon for each of these from [email or web address].  Out of  Pocket cost totaling $70.  Patient states that she will be able to afford these medications at discharge.  Patient does not have PCP.  I have provided the patient with and application to [facility] and Medication management.  RNCM signing off.

## 2016-03-20 NOTE — Progress Notes (Signed)
Alert and oriented. Vital signs stable . No signs of acute distress. Discharge instructions given. Patient verbalized understanding. No other issues noted at this time. No other issues noted.

## 2016-03-21 ENCOUNTER — Encounter: Payer: Self-pay | Admitting: Gastroenterology

## 2016-03-21 NOTE — Anesthesia Postprocedure Evaluation (Signed)
Anesthesia Post Note  Patient: Chloe Reeves  Procedure(s) Performed: Procedure(s) (LRB): ESOPHAGOGASTRODUODENOSCOPY (EGD) WITH PROPOFOL (N/A)  Patient location during evaluation: PACU Anesthesia Type: General Level of consciousness: awake and alert and oriented Pain management: pain level controlled Vital Signs Assessment: post-procedure vital signs reviewed and stable Respiratory status: spontaneous breathing Cardiovascular status: blood pressure returned to baseline Anesthetic complications: no    Last Vitals:  Vitals:   03/20/16 0913 03/20/16 0959  BP: (!) 154/72   Pulse: 67   Resp:    Temp:  37.1 C    Last Pain:  Vitals:   03/20/16 1059  TempSrc:   PainSc: Asleep                 Navon Kotowski

## 2016-03-30 ENCOUNTER — Encounter: Payer: Self-pay | Admitting: Surgery

## 2016-03-30 ENCOUNTER — Ambulatory Visit (INDEPENDENT_AMBULATORY_CARE_PROVIDER_SITE_OTHER): Payer: Self-pay | Admitting: Surgery

## 2016-03-30 VITALS — BP 143/90 | HR 81 | Temp 98.6°F | Wt 234.0 lb

## 2016-03-30 DIAGNOSIS — Z09 Encounter for follow-up examination after completed treatment for conditions other than malignant neoplasm: Secondary | ICD-10-CM

## 2016-03-30 NOTE — Progress Notes (Signed)
S/p Dx laparoscopy No aute abnormalities Her pain continues to improve + PO, + bm  PE NAD Abd: soft, incisions healing well, no infection. Stitches removed   A/P doing well Pending GI f/u No further  surgical interventions RTC prn No heavy lifting total 6 weeks after her surgery ( <20 lbs)

## 2016-03-30 NOTE — Patient Instructions (Signed)

## 2016-04-09 ENCOUNTER — Emergency Department
Admission: EM | Admit: 2016-04-09 | Discharge: 2016-04-09 | Disposition: A | Discharge status: Home / Self Care | Payer: Self-pay | Attending: Emergency Medicine | Admitting: Emergency Medicine

## 2016-04-09 ENCOUNTER — Emergency Department: Payer: Self-pay

## 2016-04-09 ENCOUNTER — Encounter: Payer: Self-pay | Admitting: Emergency Medicine

## 2016-04-09 DIAGNOSIS — Z87891 Personal history of nicotine dependence: Secondary | ICD-10-CM | POA: Insufficient documentation

## 2016-04-09 DIAGNOSIS — K5900 Constipation, unspecified: Secondary | ICD-10-CM | POA: Insufficient documentation

## 2016-04-09 DIAGNOSIS — I10 Essential (primary) hypertension: Secondary | ICD-10-CM | POA: Insufficient documentation

## 2016-04-09 DIAGNOSIS — R109 Unspecified abdominal pain: Secondary | ICD-10-CM

## 2016-04-09 DIAGNOSIS — E119 Type 2 diabetes mellitus without complications: Secondary | ICD-10-CM | POA: Insufficient documentation

## 2016-04-09 DIAGNOSIS — Z79899 Other long term (current) drug therapy: Secondary | ICD-10-CM | POA: Insufficient documentation

## 2016-04-09 LAB — COMPREHENSIVE METABOLIC PANEL
ALBUMIN: 4.3 g/dL (ref 3.5–5.0)
ALT: 16 U/L (ref 14–54)
ANION GAP: 9 (ref 5–15)
AST: 18 U/L (ref 15–41)
Alkaline Phosphatase: 69 U/L (ref 38–126)
BILIRUBIN TOTAL: 0.8 mg/dL (ref 0.3–1.2)
BUN: 13 mg/dL (ref 6–20)
CHLORIDE: 102 mmol/L (ref 101–111)
CO2: 27 mmol/L (ref 22–32)
Calcium: 9.2 mg/dL (ref 8.9–10.3)
Creatinine, Ser: 0.54 mg/dL (ref 0.44–1.00)
GFR calc Af Amer: >60 mL/min (ref >60)
GLUCOSE: 98 mg/dL (ref 65–99)
POTASSIUM: 3.7 mmol/L (ref 3.5–5.1)
Sodium: 138 mmol/L (ref 135–145)
TOTAL PROTEIN: 7.9 g/dL (ref 6.5–8.1)

## 2016-04-09 LAB — CBC WITH DIFFERENTIAL/PLATELET
BASOS ABS: 0 10*3/uL (ref 0–0.1)
BASOS PCT: 1 %
EOS ABS: 0.3 10*3/uL (ref 0–0.7)
EOS PCT: 4 %
HEMATOCRIT: 37.8 % (ref 35.0–47.0)
Hemoglobin: 13.3 g/dL (ref 12.0–16.0)
Lymphocytes Relative: 27 %
Lymphs Abs: 1.6 10*3/uL (ref 1.0–3.6)
MCH: 32.2 pg (ref 26.0–34.0)
MCHC: 35.2 g/dL (ref 32.0–36.0)
MCV: 91.5 fL (ref 80.0–100.0)
MONO ABS: 0.4 10*3/uL (ref 0.2–0.9)
MONOS PCT: 7 %
Neutro Abs: 3.7 10*3/uL (ref 1.4–6.5)
Neutrophils Relative %: 61 %
PLATELETS: 302 10*3/uL (ref 150–440)
RBC: 4.13 MIL/uL (ref 3.80–5.20)
RDW: 13.3 % (ref 11.5–14.5)
WBC: 6 10*3/uL (ref 3.6–11.0)

## 2016-04-09 LAB — LIPASE, BLOOD: LIPASE: 20 U/L (ref 11–51)

## 2016-04-09 LAB — URINALYSIS COMPLETE WITH MICROSCOPIC (ARMC ONLY)
BILIRUBIN URINE: NEGATIVE
GLUCOSE, UA: NEGATIVE mg/dL
Ketones, ur: NEGATIVE mg/dL
LEUKOCYTES UA: NEGATIVE
NITRITE: NEGATIVE
PH: 6 (ref 5.0–8.0)
Protein, ur: NEGATIVE mg/dL
SPECIFIC GRAVITY, URINE: 1.012 (ref 1.005–1.030)

## 2016-04-09 LAB — POCT PREGNANCY, URINE: PREG TEST UR: NEGATIVE

## 2016-04-09 MED ORDER — POLYETHYLENE GLYCOL 3350 17 G PO PACK: 17.0000 g | PACK | Freq: Every day | ORAL | 0 refills | Status: DC

## 2016-04-09 MED ORDER — FENTANYL CITRATE (PF) 100 MCG/2ML IJ SOLN
50.0000 ug | Freq: Once | INTRAMUSCULAR | Status: AC
  Administered 2016-04-09: 50 ug via INTRAVENOUS

## 2016-04-09 MED ORDER — FENTANYL CITRATE (PF) 100 MCG/2ML IJ SOLN
INTRAMUSCULAR | Status: AC
  Administered 2016-04-09: 50 ug via INTRAVENOUS
  Filled 2016-04-09: qty 2

## 2016-04-09 MED ORDER — IOPAMIDOL (ISOVUE-300) INJECTION 61%
30.0000 mL | Freq: Once | INTRAVENOUS | Status: AC
  Administered 2016-04-09: 30 mL via ORAL
  Filled 2016-04-09: qty 30

## 2016-04-09 MED ORDER — IOPAMIDOL (ISOVUE-300) INJECTION 61%
100.0000 mL | Freq: Once | INTRAVENOUS | Status: AC | PRN
  Administered 2016-04-09: 100 mL via INTRAVENOUS
  Filled 2016-04-09: qty 100

## 2016-04-09 NOTE — ED Notes (Signed)
Pt resting in bed, pt drinking contrast

## 2016-04-09 NOTE — ED Notes (Signed)
Pt discharged to home.  Family member driving.  Discharge instructions reviewed.  Verbalized understanding.  No questions or concerns at this time.  Teach back verified.  Pt in NAD.  No items left in ED.   

## 2016-04-09 NOTE — ED Provider Notes (Addendum)
[facility] Emergency Department Provider Note  ____________________________________________   I have reviewed the triage vital signs and the nursing notes.   HISTORY  Chief Complaint Abdominal Pain    HPI Chloe Reeves is a 45 y.o. female who presents today with right lower quadrant pain. Began gradually a few days ago and persists. Started on Saturday, today is Monday. She denies any other symptoms no fever no chills no dysuria no urinary frequency. Patient did have a small bowel obstruction with recent surgery last month. She denies any diarrhea. She has had a bowel movement and she still is passing flatus she believes. Her last bowel movement was Saturday. She states that she has had no dysuria no urinary frequency. This is a different pain than she had when she had her SBO. She states she has not vomited.   Is a sharp pain, it is localized to the right lower quadrant, is worse when she changes position.   Past Medical History:  Diagnosis Date  . 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 Diabetes mellitus without complication ([place])   . Heel spur   . Hypertension 2013  . Lump or mass in breast   . Morbid obesity ([place])   . Personal history of tobacco use, presenting hazards to health   . Plantar fasciitis   . Sleep apnea   . Special screening for malignant neoplasms, colon     Patient Active Problem List   Diagnosis Date Noted  . Abdominal pain, generalized   . Bariatric surgery status   . Small bowel obstruction   . Nausea without vomiting   . SBO (small bowel obstruction) 03/14/2016    Past Surgical History:  Procedure Laterality Date  . COLONOSCOPY  2012   Dr. Ernst Breach  . ESOPHAGOGASTRODUODENOSCOPY (EGD) WITH PROPOFOL N/A 03/20/2016   Procedure: ESOPHAGOGASTRODUODENOSCOPY (EGD) WITH PROPOFOL;  Surgeon: Lucilla Lame, MD;  Location: [facility] ENDOSCOPY;  Service: Endoscopy;  Laterality: N/A;  . LAPAROSCOPIC GASTRIC  SLEEVE RESECTION N/A 2014  . LAPAROSCOPY N/A 03/16/2016   Procedure: LAPAROSCOPY DIAGNOSTIC;  Surgeon: Dia Crawford III, MD;  Location: [facility] ORS;  Service: General;  Laterality: N/A;    Prior to Admission medications   Medication Sig Start Date End Date Taking? Authorizing Provider  gabapentin (NEURONTIN) 300 MG capsule Take 1 capsule (300 mg total) by mouth 3 (three) times daily. 03/20/16   Diego F Pabon, MD  omeprazole (PRILOSEC) 40 MG capsule Take 1 capsule (40 mg total) by mouth daily. 03/20/16   Diego F Pabon, MD  ondansetron (ZOFRAN) 4 MG tablet Take 1 tablet (4 mg total) by mouth every 8 (eight) hours as needed for nausea or vomiting. 03/20/16   Diego F Pabon, MD  oxyCODONE-acetaminophen (PERCOCET) 7.5-325 MG tablet Take 1 tablet by mouth every 4 (four) hours as needed for severe pain. 03/20/16   Diego Sarita Haver, MD    Allergies Codeine; Scopolamine; and Benadryl [diphenhydramine hcl]  Family History  Problem Relation Age of Onset  . Cancer Other     pt is unsure of details regarding FH of cancer    Social History Social History  Substance Use Topics  . Smoking status: Former Research scientist (life sciences)  . Smokeless tobacco: Never Used  . Alcohol use Yes     Comment: "occassional"    Review of Systems Constitutional: No fever/chills Eyes: No visual changes. ENT: No sore throat. No stiff neck no neck pain Cardiovascular: Denies chest pain. Respiratory: Denies shortness of breath. Gastrointestinal:  no vomiting.  No diarrhea.  No constipation. Genitourinary: Negative for dysuria. Musculoskeletal: Negative lower extremity swelling Skin: Negative for rash. Neurological: Negative for severe headaches, focal weakness or numbness. 10-point ROS otherwise negative.  ____________________________________________   PHYSICAL EXAM:  VITAL SIGNS: ED Triage Vitals  Enc Vitals Group     BP 04/09/16 1727 (!) 157/91     Pulse Rate 04/09/16 1727 89     Resp 04/09/16 1727 20     Temp 04/09/16 1727 98.4 F  (36.9 C)     Temp Source 04/09/16 1727 Oral     SpO2 04/09/16 1727 98 %     Weight 04/09/16 1727 225 lb (102.1 kg)     Height 04/09/16 1727 5\' 2"  (1.575 m)     Head Circumference --      Peak Flow --      Pain Score 04/09/16 1747 5     Pain Loc --      Pain Edu? --      Excl. in [place]? --     Constitutional: Alert and oriented. Well appearing and in no acute distress. Eyes: Conjunctivae are normal. PERRL. EOMI. Head: Atraumatic. Nose: No congestion/rhinnorhea. Mouth/Throat: Mucous membranes are moist.  Oropharynx non-erythematous. Neck: No stridor.   Nontender with no meningismus Cardiovascular: Normal rate, regular rhythm. Grossly normal heart sounds.  Good peripheral circulation. Respiratory: Normal respiratory effort.  No retractions. Lungs CTAB. Abdominal: Soft and focally tender to palpation the right lower quadrant with voluntary guarding and no rebound, morbid obesity noted. Surgical sites appears to be uninfected. No distention. no rebound Back:  There is no focal tenderness or step off.  there is no midline tenderness there are no lesions noted. there is no CVA tenderness Musculoskeletal: No lower extremity tenderness, no upper extremity tenderness. No joint effusions, no DVT signs strong distal pulses no edema Neurologic:  Normal speech and language. No gross focal neurologic deficits are appreciated.  Skin:  Skin is warm, dry and intact. No rash noted. Psychiatric: Mood and affect are normal. Speech and behavior are normal.  ____________________________________________   LABS (all labs ordered are listed, but only abnormal results are displayed)  Labs Reviewed  CBC WITH DIFFERENTIAL/PLATELET  COMPREHENSIVE METABOLIC PANEL  LIPASE, BLOOD  URINALYSIS COMPLETEWITH MICROSCOPIC ([place])  POCT PREGNANCY, URINE  POC URINE PREG, ED   ____________________________________________  EKG  I personally interpreted any EKGs ordered by me or  triage  ____________________________________________  RADIOLOGY  I reviewed any imaging ordered by me or triage that were performed during my shift and, if possible, patient and/or family made aware of any abnormal findings. ____________________________________________   PROCEDURES  Procedure(s) performed: None  Procedures  Critical Care performed: None  ____________________________________________   INITIAL IMPRESSION / ASSESSMENT AND PLAN / ED COURSE  Pertinent labs & imaging results that were available during my care of the patient were reviewed by me and considered in my medical decision making (see chart for details).  Patient with very focal right lower quadrant pain in the context of recent abdominal surgery, blood work vitals and started somewhat reassuring however given her recent surgical history will obtain a CT scan and discuss with surgery as needed  ----------------------------------------- 9:08 PM on 04/09/2016 -----------------------------------------  Discussed with Dr. Heath Lark, we looked at the CT scan together we discussed all of her findings and she agrees with discharge. No further intervention is needed. Patient does have a large stool burden seen on CT scan although not pathologically large and therefore not, just  upon by surgery. It is not by surgery this could be contributing to her discomfort. Patient had a negative exploratory laparotomy For abdominal pain during her prior visit. It turns out, on exploratory laparoscopy, they did not see an SBO. In any event, patient's gastric sleeves appears not to be having any complications she had a recent negative EGD she has follow-up with GI in 2 days her blood work labs vitals and CT scan are all reassuring and we will discharge her with close outpatient follow-up and return precautions. Patient very comfortable with this plan.  Clinical Course   ____________________________________________   FINAL CLINICAL  IMPRESSION(S) / ED DIAGNOSES  Final diagnoses:  None      This chart was dictated using voice recognition software.  Despite best efforts to proofread,  errors can occur which can change meaning.      Schuyler Amor, MD 04/09/16 [place], MD 04/09/16 2109

## 2016-04-09 NOTE — ED Triage Notes (Signed)
Reports RLQ pain onset 3 nights ago, worse yesterday and today.  Denies fever.  Skin w/d.

## 2016-04-12 ENCOUNTER — Ambulatory Visit (INDEPENDENT_AMBULATORY_CARE_PROVIDER_SITE_OTHER): Payer: Self-pay | Admitting: Gastroenterology

## 2016-04-12 ENCOUNTER — Encounter: Payer: Self-pay | Admitting: Gastroenterology

## 2016-04-12 VITALS — BP 161/89 | HR 81 | Temp 98.4°F | Ht 62.0 in | Wt 236.0 lb

## 2016-04-12 DIAGNOSIS — K5909 Other constipation: Secondary | ICD-10-CM

## 2016-04-12 DIAGNOSIS — K5900 Constipation, unspecified: Secondary | ICD-10-CM

## 2016-04-12 DIAGNOSIS — R110 Nausea: Secondary | ICD-10-CM

## 2016-04-12 NOTE — Progress Notes (Signed)
Primary Care Physician: Perrin Maltese, MD  Primary Gastroenterologist:  Dr. Lucilla Lame  Chief Complaint  Patient presents with  . Hospitalization Follow-up    HPI: Chloe Reeves is a 45 y.o. female here for follow-up after being emergency room with nausea and abdominal pain. The patient states that she had been thought to have a bowel obstruction and had exploratory surgery that did not show any bowel obstruction. The patient states that she will In the middle of night with severe abdominal pain. She also reports that she had nausea for 3 days. The patient states that the abdominal pain is worse when she is bending up or sitting down and when she bends over but is better when she lays down. The patient has no change in bowel habits and no vomiting associated with her symptoms. She also states that food and drinking do not cause the abdominal discomfort to be any better or worse. She also reports that the pain is pinpointed in the area where she had surgery.  Current Outpatient Prescriptions  Medication Sig Dispense Refill  . gabapentin (NEURONTIN) 300 MG capsule Take 1 capsule (300 mg total) by mouth 3 (three) times daily. 90 capsule 0  . omeprazole (PRILOSEC) 40 MG capsule Take 1 capsule (40 mg total) by mouth daily. 30 capsule 0  . ondansetron (ZOFRAN) 4 MG tablet Take 1 tablet (4 mg total) by mouth every 8 (eight) hours as needed for nausea or vomiting. 20 tablet 0  . oxyCODONE-acetaminophen (PERCOCET) 7.5-325 MG tablet Take 1 tablet by mouth every 4 (four) hours as needed for severe pain. (Patient not taking: Reported on 04/12/2016) 30 tablet 0  . polyethylene glycol (MIRALAX) packet Take 17 g by mouth daily. (Patient not taking: Reported on 04/12/2016) 14 each 0   No current facility-administered medications for this visit.     Allergies as of 04/12/2016 - Review Complete 04/12/2016  Allergen Reaction Noted  . Scopolamine Other (See Comments) 01/22/2015  . Benadryl  [diphenhydramine hcl] Rash 11/03/2012    ROS:  General: Negative for anorexia, weight loss, fever, chills, fatigue, weakness. ENT: Negative for hoarseness, difficulty swallowing , nasal congestion. CV: Negative for chest pain, angina, palpitations, dyspnea on exertion, peripheral edema.  Respiratory: Negative for dyspnea at rest, dyspnea on exertion, cough, sputum, wheezing.  GI: See history of present illness. GU:  Negative for dysuria, hematuria, urinary incontinence, urinary frequency, nocturnal urination.  Endo: Negative for unusual weight change.    Physical Examination:   BP (!) 161/89   Pulse 81   Temp 98.4 F (36.9 C) (Oral)   Ht 5\' 2"  (1.575 m)   Wt 236 lb (107 kg)   LMP 03/27/2016   BMI 43.16 kg/m   General: Well-nourished, well-developed in no acute distress.  Eyes: No icterus. Conjunctivae pink. Mouth: Oropharyngeal mucosa moist and pink , no lesions erythema or exudate. Lungs: Clear to auscultation bilaterally. Non-labored. Heart: Regular rate and rhythm, no murmurs rubs or gallops.  Abdomen: Bowel sounds are normal, positive tenderness to 1 finger palpation while flexing the abdominal wall muscles I raising the leg 6 inches above the exam table, nondistended, no hepatosplenomegaly or masses, no abdominal bruits or hernia , no rebound or guarding.   Extremities: No lower extremity edema. No clubbing or deformities. Neuro: Alert and oriented x 3.  Grossly intact. Skin: Warm and dry, no jaundice.   Psych: Alert and cooperative, normal mood and affect.  Labs:    Imaging Studies: Ct Abdomen Pelvis W Contrast  Result Date: 04/09/2016 CLINICAL DATA:  Right lower quadrant abdominal pain EXAM: CT ABDOMEN AND PELVIS WITH CONTRAST TECHNIQUE: Multidetector CT imaging of the abdomen and pelvis was performed using the standard protocol following bolus administration of intravenous contrast. CONTRAST:  121mL ISOVUE-300 IOPAMIDOL (ISOVUE-300) INJECTION 61% COMPARISON:  CT  abdomen pelvis 03/14/2016 FINDINGS: Lower chest: No pulmonary nodules. No visible pleural or pericardial effusion. Hepatobiliary: Normal hepatic size and contours without focal liver lesion. No perihepatic ascites. No intra- or extrahepatic biliary dilatation. Normal gallbladder. Pancreas: Normal pancreatic contours and enhancement. No peripancreatic fluid collection or pancreatic ductal dilatation. Spleen: Normal. Adrenals/Urinary Tract: Normal adrenal glands. No hydronephrosis or solid renal mass. Stomach/Bowel: No abnormal bowel dilatation. No bowel wall thickening or adjacent fat stranding to indicate acute inflammation. No abdominal fluid collection. Normal appendix. Status post gastric bypass. Vascular/Lymphatic: Normal course and caliber of the major abdominal vessels. No abdominal or pelvic adenopathy. Reproductive: Normal uterus and ovaries. Musculoskeletal: Multilevel facet arthrosis. No bony spinal canal stenosis. No lytic lesions. Multilevel lower thoracic osteophytosis. Normal visualized extrathoracic and extraperitoneal soft tissues. Other: No contributory non-categorized findings. IMPRESSION: No acute abnormality of the abdomen or pelvis. Electronically Signed   By: Ulyses Jarred M.D.   On: 04/09/2016 20:47   Ct Abdomen Pelvis W Contrast  Result Date: 03/14/2016 CLINICAL DATA:  Patient is status post gastric bypass procedure. Lower abdominal pain with nausea and vomiting, acute EXAM: CT ABDOMEN AND PELVIS WITH CONTRAST TECHNIQUE: Multidetector CT imaging of the abdomen and pelvis was performed using the standard protocol following bolus administration of intravenous contrast. Oral contrast was also administered. CONTRAST:  113mL ISOVUE-300 IOPAMIDOL (ISOVUE-300) INJECTION 61% COMPARISON:  June 13, 2011 FINDINGS: Lower chest: Lung bases are clear. Hepatobiliary: No focal liver lesions are identified. Gallbladder wall is not appreciably thickened. There is no biliary duct dilatation. Pancreas:  There is no pancreatic mass or inflammatory focus. Spleen: No splenic lesions are evident. Adrenals/Urinary Tract: Adrenals appear unremarkable. Kidneys bilaterally show no evident mass or hydronephrosis on either side. There is no renal or ureteral calculus on either side. Urinary bladder is midline with wall thickness within normal limits. Stomach/Bowel: The patient is status post gastric bypass procedure. There is no wall thickening or fluid in the region of the previous gastric bypass. There are loops of dilated small bowel in the right mid abdomen with a transition zone just beyond this area of bowel dilatation. There is bowel wall thickening in this area. There is surrounding mesenteric thickening in this area. The appearance is consistent with a developing closed loop obstruction from internal hernia in the right mid abdomen. There is no free air or portal venous air. Bowel elsewhere appears unremarkable. Vascular/Lymphatic: There is no abdominal aortic aneurysm. No vascular lesions are evident on this study. No adenopathy is appreciable in the abdomen or pelvis. Reproductive: Uterus is anteverted. There is no pelvic mass or pelvic fluid collection. Other: Appendix appears unremarkable. There is no abscess. A small amount of ascites is noted near the liver edge. Musculoskeletal: There are no blastic or lytic bone lesions. There are scattered foci of degenerative change in the lower thoracic and lumbar spine. No intramuscular or abdominal wall lesion. IMPRESSION: Patient is status post gastric bypass procedure. Developing closed loop small-bowel obstruction in the right mid abdomen. There is a transition zone in this area. Contrast does extend through this area. There is wall thickening in the area of the internal hernia/closed loop as well as mesenteric thickening. No free air. Small amount of ascites.  No abscess. Appendix region appears normal. No renal or ureteral calculus. No hydronephrosis. Critical  Value/emergent results were called by telephone at the time of interpretation on 03/14/2016 at 9:35 am to Dr. Delman Kitten , who verbally acknowledged these results. Critical Value/emergent results were called by telephone at the time of interpretation on 03/14/2016 at 9:35 am to Dr. Delman Kitten , who verbally acknowledged these results. Electronically Signed   By: Lowella Grip III M.D.   On: 03/14/2016 09:35   Dg Abd 2 Views  Result Date: 03/15/2016 CLINICAL DATA:  Pt states she is having a little abdominal discomfort this morning, hx of gastric sleeve 2014. EXAM: ABDOMEN - 2 VIEW COMPARISON:  03/14/2016 FINDINGS: Bowel gas pattern is nonobstructive. Residual contrast is identified within nondilated loops of colon. No evidence for free intraperitoneal air on these supine views. Visualized osseous structures have a normal appearance. IMPRESSION: No evidence for acute  abnormality. Electronically Signed   By: Nolon Nations M.D.   On: 03/15/2016 08:55    Assessment and Plan:   Chloe Reeves is a 45 y.o. y/o female who comes in with abdominal wall pain which is reproducible with light palpation of the abdominal wall while flexing the abdominal wall muscles by raising the legs 6 inches above the exam table. The patient's symptoms are not related to any change in bowel habits or when she eats or drinks. The patient has been explained that she is likely having nausea from her pain and her pain is musculoskeletal. The patient an upper endoscopy which was normal. Since the patient does report that she has some intermittent episodes of constipation the patient will be started on a trial of MiraLAX and she will also be doubled up on her PPI to see if any decrease acid results in less nausea. The patient has been explained the plan and agrees with it.   Note: This dictation was prepared with Dragon dictation along with smaller phrase technology. Any transcriptional errors that result from this process are  unintentional.

## 2019-05-15 ENCOUNTER — Other Ambulatory Visit: Payer: Self-pay

## 2019-05-15 DIAGNOSIS — Z20822 Contact with and (suspected) exposure to covid-19: Secondary | ICD-10-CM

## 2019-05-18 LAB — NOVEL CORONAVIRUS, NAA: SARS-CoV-2, NAA: NOT DETECTED

## 2021-01-26 ENCOUNTER — Other Ambulatory Visit: Payer: Self-pay | Admitting: Internal Medicine

## 2021-01-26 DIAGNOSIS — Z1231 Encounter for screening mammogram for malignant neoplasm of breast: Secondary | ICD-10-CM

## 2021-01-27 ENCOUNTER — Other Ambulatory Visit: Payer: Self-pay | Admitting: Internal Medicine

## 2021-01-27 DIAGNOSIS — N951 Menopausal and female climacteric states: Secondary | ICD-10-CM

## 2021-02-16 ENCOUNTER — Ambulatory Visit
Admission: RE | Admit: 2021-02-16 | Discharge: 2021-02-16 | Disposition: A | Discharge status: Home / Self Care | Payer: 59 | Source: Ambulatory Visit | Attending: Internal Medicine | Admitting: Internal Medicine

## 2021-02-16 ENCOUNTER — Other Ambulatory Visit: Payer: Self-pay

## 2021-02-16 DIAGNOSIS — Z1231 Encounter for screening mammogram for malignant neoplasm of breast: Secondary | ICD-10-CM | POA: Insufficient documentation

## 2021-02-16 DIAGNOSIS — N951 Menopausal and female climacteric states: Secondary | ICD-10-CM | POA: Diagnosis present

## 2021-11-17 IMAGING — MG MM DIGITAL SCREENING BILAT W/ TOMO AND CAD
8 series · 8 of 24 positions shown · non-contrast
Comparison: Previous exam(s).

CLINICAL DATA: Screening.

EXAM:
DIGITAL SCREENING BILATERAL MAMMOGRAM WITH TOMOSYNTHESIS AND CAD
TECHNIQUE: Bilateral screening digital craniocaudal and mediolateral oblique
mammograms were obtained. Bilateral screening digital breast
tomosynthesis was performed. The images were evaluated with
computer-aided detection.

[R MLO synth-2D]
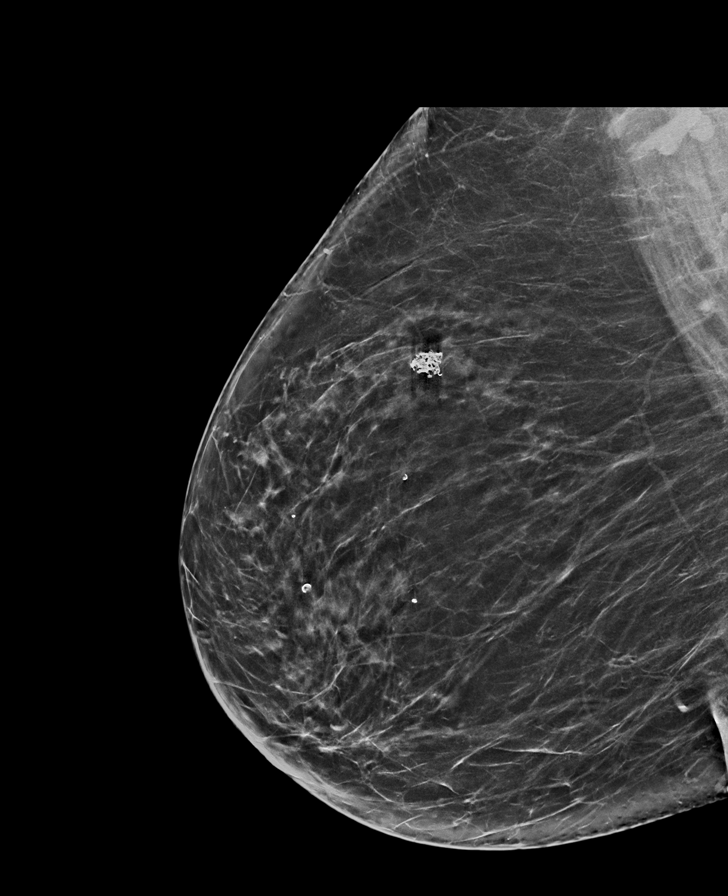

[L MLO synth-2D]
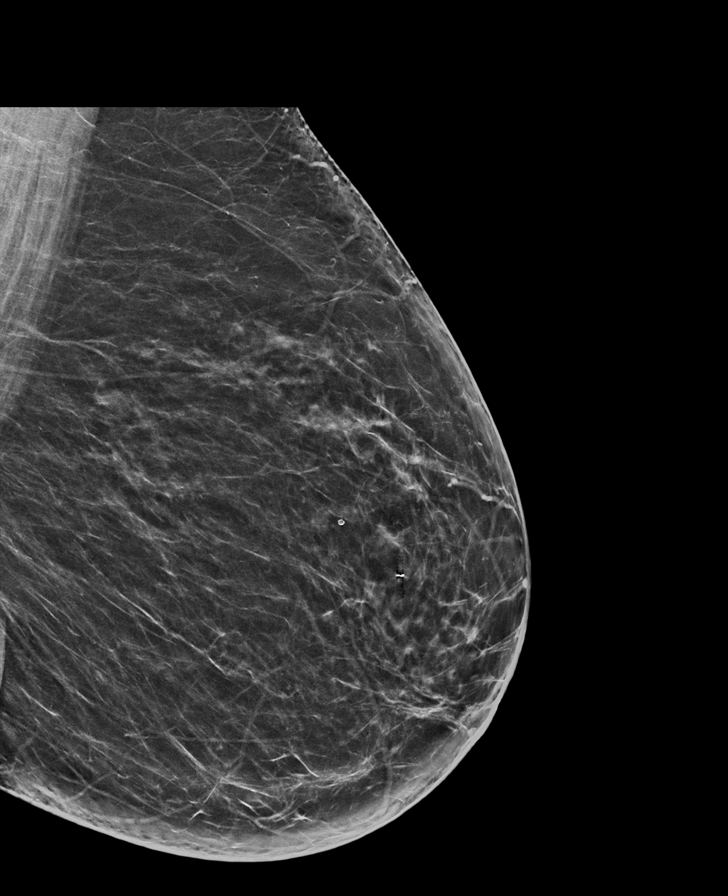

[R CC synth-2D]
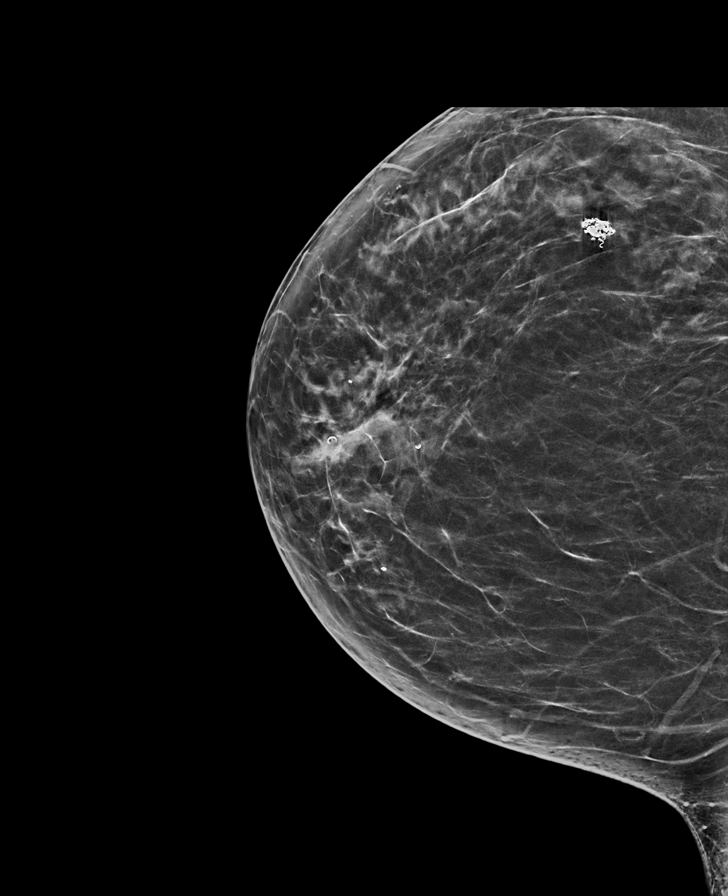

[L CC synth-2D]
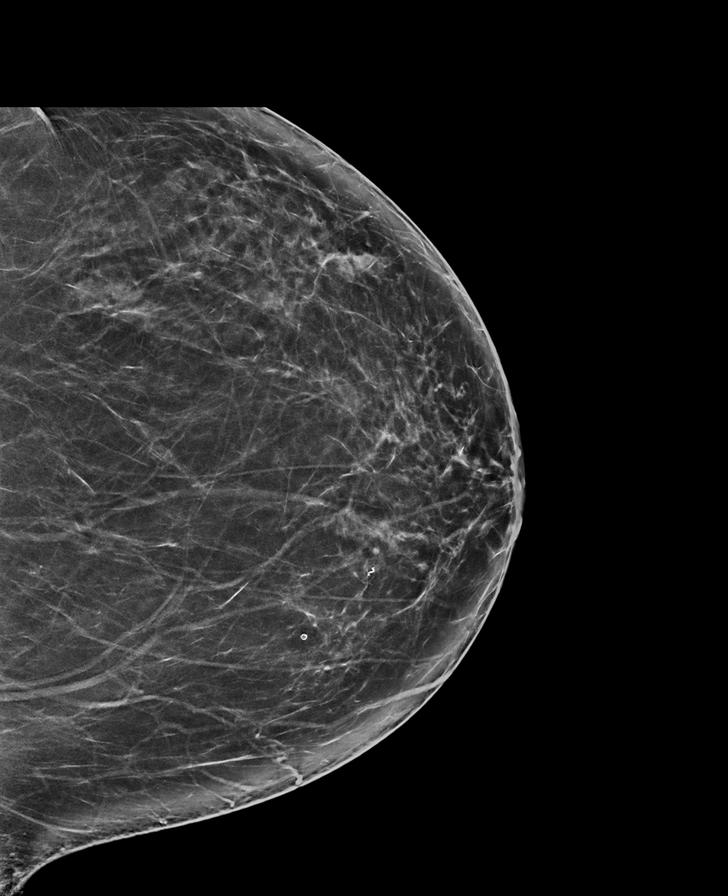

[L CC tomo · tomo slice 41/80.0]
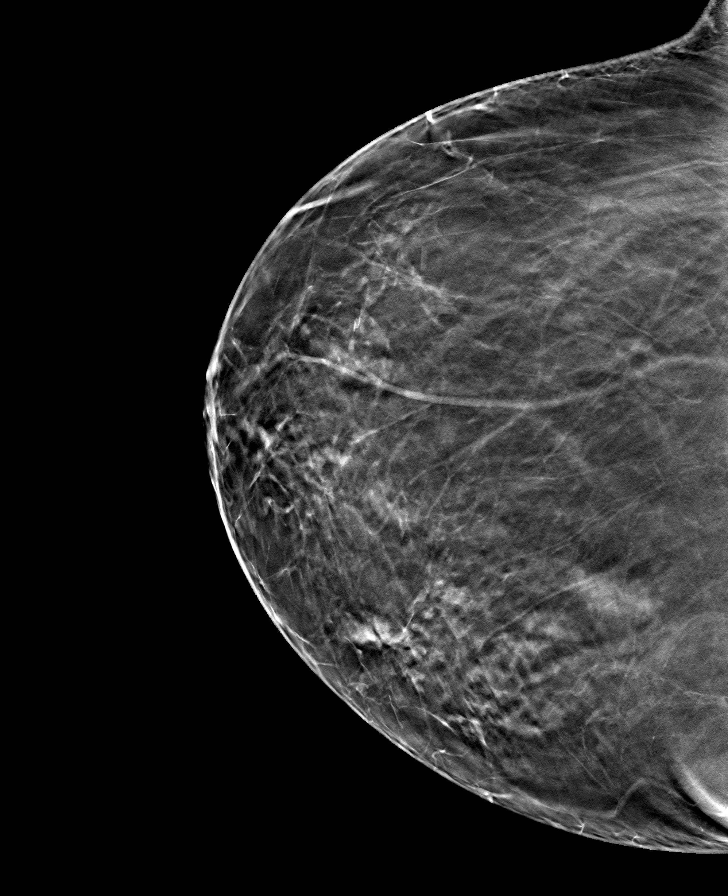

[R CC tomo · tomo slice 37/73.0]
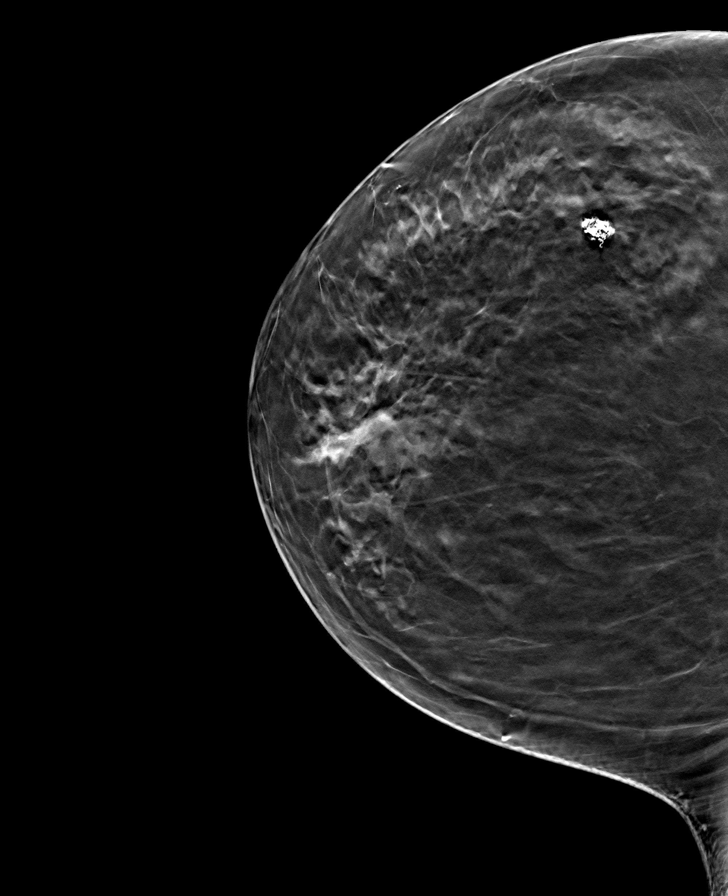

[L MLO tomo · tomo slice 39/76.0]
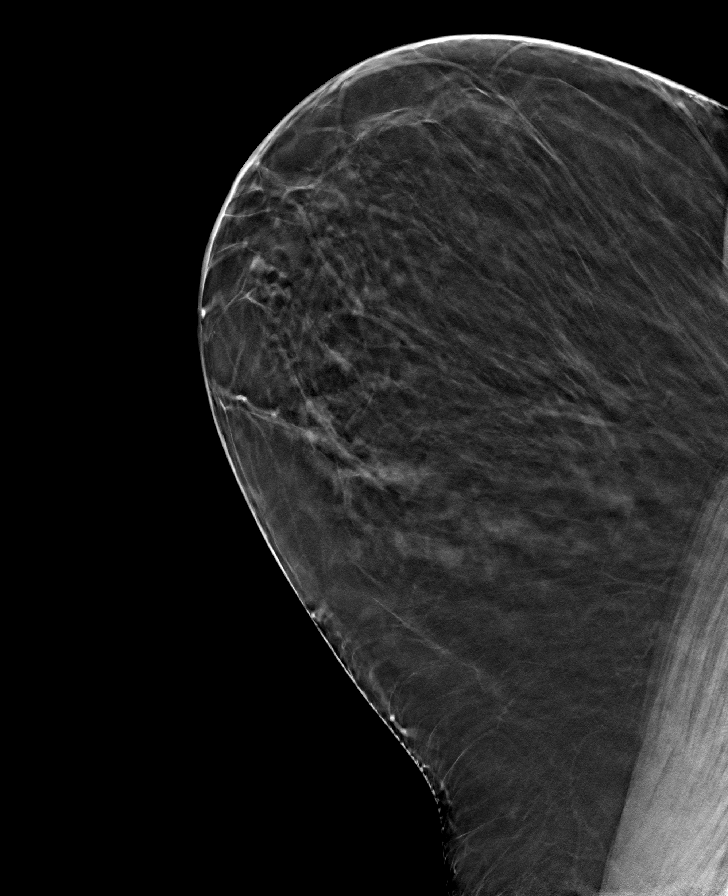

[R MLO tomo · tomo slice 39/77.0]
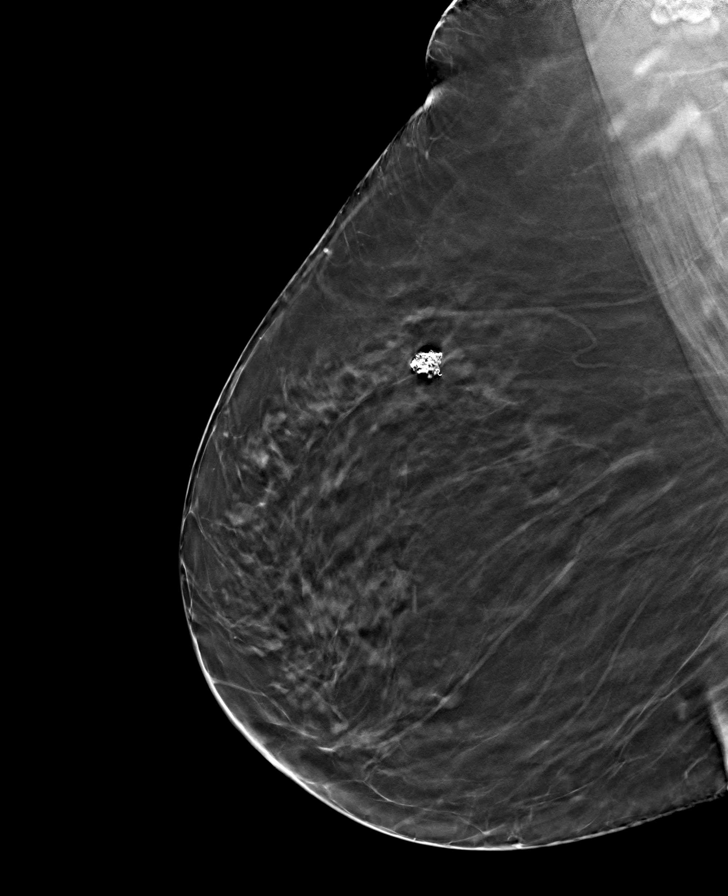

[8 of 24 positions shown; findings below may reference images not displayed]

ACR Breast Density Category b: There are scattered areas of
fibroglandular density.
FINDINGS: There are no findings suspicious for malignancy.
IMPRESSION: No mammographic evidence of malignancy. A result letter of this
screening mammogram will be mailed directly to the patient.

RECOMMENDATION:
Screening mammogram in one year. (Code:51-O-LD2)

BI-RADS CATEGORY  1: Negative.

## 2022-03-12 ENCOUNTER — Other Ambulatory Visit: Payer: Self-pay | Admitting: Internal Medicine

## 2022-03-12 DIAGNOSIS — R7302 Impaired glucose tolerance (oral): Secondary | ICD-10-CM | POA: Diagnosis not present

## 2022-03-12 DIAGNOSIS — Z1231 Encounter for screening mammogram for malignant neoplasm of breast: Secondary | ICD-10-CM

## 2022-03-12 DIAGNOSIS — I10 Essential (primary) hypertension: Secondary | ICD-10-CM | POA: Diagnosis not present

## 2022-03-12 DIAGNOSIS — E782 Mixed hyperlipidemia: Secondary | ICD-10-CM | POA: Diagnosis not present

## 2022-03-12 DIAGNOSIS — E669 Obesity, unspecified: Secondary | ICD-10-CM | POA: Diagnosis not present

## 2022-03-14 DIAGNOSIS — I10 Essential (primary) hypertension: Secondary | ICD-10-CM | POA: Diagnosis not present

## 2022-03-14 DIAGNOSIS — E782 Mixed hyperlipidemia: Secondary | ICD-10-CM | POA: Diagnosis not present

## 2022-03-14 DIAGNOSIS — R7302 Impaired glucose tolerance (oral): Secondary | ICD-10-CM | POA: Diagnosis not present

## 2022-03-26 DIAGNOSIS — R69 Illness, unspecified: Secondary | ICD-10-CM | POA: Diagnosis not present

## 2022-03-26 DIAGNOSIS — H10021 Other mucopurulent conjunctivitis, right eye: Secondary | ICD-10-CM | POA: Diagnosis not present

## 2022-03-26 DIAGNOSIS — I10 Essential (primary) hypertension: Secondary | ICD-10-CM | POA: Diagnosis not present

## 2022-03-26 DIAGNOSIS — E782 Mixed hyperlipidemia: Secondary | ICD-10-CM | POA: Diagnosis not present

## 2022-03-26 DIAGNOSIS — E669 Obesity, unspecified: Secondary | ICD-10-CM | POA: Diagnosis not present

## 2022-05-01 DIAGNOSIS — E782 Mixed hyperlipidemia: Secondary | ICD-10-CM | POA: Diagnosis not present

## 2022-05-01 DIAGNOSIS — I10 Essential (primary) hypertension: Secondary | ICD-10-CM | POA: Diagnosis not present

## 2022-05-01 DIAGNOSIS — J069 Acute upper respiratory infection, unspecified: Secondary | ICD-10-CM | POA: Diagnosis not present

## 2022-05-01 DIAGNOSIS — R69 Illness, unspecified: Secondary | ICD-10-CM | POA: Diagnosis not present

## 2022-09-14 ENCOUNTER — Ambulatory Visit (INDEPENDENT_AMBULATORY_CARE_PROVIDER_SITE_OTHER): Payer: Self-pay | Admitting: Internal Medicine

## 2022-09-14 ENCOUNTER — Encounter: Payer: Self-pay | Admitting: Internal Medicine

## 2022-09-14 VITALS — BP 138/80 | HR 80 | Ht 62.0 in | Wt 233.0 lb

## 2022-09-14 DIAGNOSIS — I10 Essential (primary) hypertension: Secondary | ICD-10-CM | POA: Diagnosis not present

## 2022-09-14 DIAGNOSIS — R7302 Impaired glucose tolerance (oral): Secondary | ICD-10-CM

## 2022-09-14 DIAGNOSIS — F411 Generalized anxiety disorder: Secondary | ICD-10-CM | POA: Diagnosis not present

## 2022-09-14 DIAGNOSIS — E782 Mixed hyperlipidemia: Secondary | ICD-10-CM | POA: Diagnosis not present

## 2022-09-14 MED ORDER — AMLODIPINE BESYLATE 5 MG PO TABS: 5.0000 mg | ORAL_TABLET | Freq: Every day | ORAL | 3 refills | Status: DC

## 2022-09-14 MED ORDER — BUSPIRONE HCL 15 MG PO TABS: 15.0000 mg | ORAL_TABLET | Freq: Two times a day (BID) | ORAL | 3 refills | Status: DC

## 2022-09-14 MED ORDER — BUPROPION HCL ER (XL) 300 MG PO TB24: 300.0000 mg | ORAL_TABLET | Freq: Every day | ORAL | 3 refills | Status: DC

## 2022-09-14 MED ORDER — ROSUVASTATIN CALCIUM 20 MG PO TABS: 10.0000 mg | ORAL_TABLET | Freq: Every day | ORAL | 3 refills | Status: DC

## 2022-09-14 MED ORDER — BENAZEPRIL HCL 10 MG PO TABS: 10.0000 mg | ORAL_TABLET | Freq: Every day | ORAL | 3 refills | Status: DC

## 2022-09-14 NOTE — Progress Notes (Signed)
Established Patient Office Visit  Subjective:  Patient ID: Chloe Reeves, female    DOB: 1970-09-18  Age: 52 y.o. MRN: [id number]  Chief Complaint  Patient presents with   Follow-up    Rx refills    Patient comes in for her follow-up today.  She has not been in since September 2023.  She has gained some weight.  And also complains of increased anxiety while still taking Wellbutrin 300 mg/day.  Patient admits that she is under a lot of stress these days.  Her blood pressure is slightly high.  She needs refills on all her medications.  Patient will come back fasting for blood work.  She still has not done a mammogram or colonoscopy because her insurance does not cover that.      No other concerns at this time.   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 Ernst Breach   ESOPHAGOGASTRODUODENOSCOPY (EGD) WITH PROPOFOL N/A 03/20/2016   Procedure: ESOPHAGOGASTRODUODENOSCOPY (EGD) WITH PROPOFOL;  Surgeon: Lucilla Lame, MD;  Location: [facility];  Service: Endoscopy;  Laterality: N/A;   LAPAROSCOPIC GASTRIC SLEEVE RESECTION N/A 2014   LAPAROSCOPY N/A 03/16/2016   Procedure: LAPAROSCOPY DIAGNOSTIC;  Surgeon: Dia Crawford III, MD;  Location: [facility];  Service: General;  Laterality: N/A;    Social History   Socioeconomic History   Marital status: Single    Spouse name: Not on file   Number of children: Not on file   Years of education: Not on file   Highest education level: Not on file  Occupational History   Not on file  Tobacco Use   Smoking status: Former    Smokeless tobacco: Never  Substance and Sexual Activity   Alcohol use: Yes    Comment: "occassional"   Drug use: No   Sexual activity: Not Currently  Other Topics Concern   Not on file  Social History Narrative   Not on file   Social Determinants of Health   Financial Resource Strain: Not on file  Food Insecurity: Not on file  Transportation Needs: Not on file  Physical Activity: Not on file  Stress: Not on file  Social Connections: Not on file  Intimate Partner Violence: Not on file    Family History  Problem Relation Age of Onset   Cancer Other        pt is unsure of details regarding FH of cancer    Allergies  Allergen Reactions   Scopolamine Other (See Comments)    Dizziness, hot flashes, passes out   Benadryl [Diphenhydramine Hcl] Rash    Review of Systems  Constitutional:  Positive for malaise/fatigue. Negative for chills, diaphoresis, fever and weight loss.  HENT: Negative.    Eyes: Negative.  Negative for discharge and redness.  Respiratory: Negative.    Cardiovascular: Negative.   Gastrointestinal:  Negative for abdominal pain, constipation, diarrhea, heartburn, nausea and vomiting.  Genitourinary: Negative.   Musculoskeletal: Negative.   Neurological: Negative.   Psychiatric/Behavioral:  Negative for  depression, hallucinations, memory loss, substance abuse and suicidal ideas. The patient is nervous/anxious. The patient does not have insomnia.        Objective:   BP 138/80   Pulse 80   Ht 5\' 2"  (1.575 m)   Wt 233 lb (105.7 kg)   LMP 03/27/2016   SpO2 99%   BMI 42.62 kg/m   Vitals:   09/14/22 1146  BP: 138/80  Pulse: 80  Height: 5\' 2"  (1.575 m)  Weight: 233 lb (105.7 kg)  SpO2: 99%  BMI (Calculated): 42.61    Physical Exam Vitals and nursing note reviewed.  Constitutional:      Appearance: Normal appearance. She is obese.  Cardiovascular:     Rate and Rhythm: Normal rate and regular rhythm.     Pulses: Normal pulses.     Heart  sounds: Normal heart sounds.  Pulmonary:     Effort: Pulmonary effort is normal.     Breath sounds: Normal breath sounds.  Abdominal:     General: Abdomen is flat. Bowel sounds are normal.     Palpations: Abdomen is soft.  Musculoskeletal:        General: Normal range of motion.     Cervical back: Normal range of motion and neck supple.  Skin:    General: Skin is warm.  Neurological:     General: No focal deficit present.     Mental Status: She is alert and oriented to person, place, and time.      No results found for any visits on 09/14/22.  No results found for this or any previous visit (from the past 2160 hour(s)).    Assessment & Plan:  Will add BuSpar 15 mg twice a day to her Wellbutrin.  Rest of the medications have been refilled.  Patient will come back fasting 1 morning for blood work. Problem List Items Addressed This Visit     Essential hypertension, benign - Primary   Relevant Medications   amLODipine (NORVASC) 5 MG tablet   benazepril (LOTENSIN) 10 MG tablet   rosuvastatin (CRESTOR) 20 MG tablet   Other Relevant Orders   CMP14+EGFR   GAD (generalized anxiety disorder)   Relevant Medications   buPROPion (WELLBUTRIN XL) 300 MG 24 hr tablet   busPIRone (BUSPAR) 15 MG tablet   Mixed hyperlipidemia   Relevant Medications   amLODipine (NORVASC) 5 MG tablet   benazepril (LOTENSIN) 10 MG tablet   rosuvastatin (CRESTOR) 20 MG tablet   Other Relevant Orders   Lipid Panel w/o Chol/HDL Ratio   Impaired glucose tolerance   Relevant Orders   Hemoglobin A1c    Return in about 4 weeks (around 10/12/2022).   Total time spent: 30 minutes  Perrin Maltese, MD  09/14/2022

## 2022-10-04 ENCOUNTER — Other Ambulatory Visit: Payer: Self-pay

## 2022-10-06 ENCOUNTER — Other Ambulatory Visit: Payer: Self-pay | Admitting: Internal Medicine

## 2022-10-06 DIAGNOSIS — F411 Generalized anxiety disorder: Secondary | ICD-10-CM

## 2022-10-12 ENCOUNTER — Ambulatory Visit: Payer: Self-pay | Admitting: Internal Medicine

## 2022-12-13 ENCOUNTER — Other Ambulatory Visit: Payer: Self-pay | Admitting: Family

## 2022-12-13 DIAGNOSIS — F411 Generalized anxiety disorder: Secondary | ICD-10-CM

## 2023-06-24 ENCOUNTER — Telehealth: Payer: Self-pay | Admitting: Internal Medicine

## 2023-06-24 NOTE — Telephone Encounter (Signed)
Patient left VM stating she needs a note from Dr. Welton Flakes stating how her animals help her with her depression since they are her emotional support animals. She said we've gave a note before so she could have the cat and dog but now they're requesting more information on how the animals are good for her.

## 2023-06-28 ENCOUNTER — Encounter: Payer: Self-pay | Admitting: Internal Medicine

## 2023-06-28 ENCOUNTER — Ambulatory Visit (INDEPENDENT_AMBULATORY_CARE_PROVIDER_SITE_OTHER): Payer: 59 | Admitting: Internal Medicine

## 2023-06-28 VITALS — BP 130/90 | HR 75 | Ht 62.0 in | Wt 242.8 lb

## 2023-06-28 DIAGNOSIS — F411 Generalized anxiety disorder: Secondary | ICD-10-CM | POA: Diagnosis not present

## 2023-06-28 DIAGNOSIS — I10 Essential (primary) hypertension: Secondary | ICD-10-CM | POA: Diagnosis not present

## 2023-06-28 DIAGNOSIS — Z9884 Bariatric surgery status: Secondary | ICD-10-CM

## 2023-06-28 DIAGNOSIS — G2581 Restless legs syndrome: Secondary | ICD-10-CM

## 2023-06-28 DIAGNOSIS — R110 Nausea: Secondary | ICD-10-CM

## 2023-06-28 DIAGNOSIS — E782 Mixed hyperlipidemia: Secondary | ICD-10-CM

## 2023-06-28 DIAGNOSIS — K219 Gastro-esophageal reflux disease without esophagitis: Secondary | ICD-10-CM

## 2023-06-28 MED ORDER — CELECOXIB 200 MG PO CAPS: 200.0000 mg | ORAL_CAPSULE | Freq: Every day | ORAL | 0 refills | Status: DC | PRN

## 2023-06-28 MED ORDER — ROPINIROLE HCL 0.25 MG PO TABS: 0.2500 mg | ORAL_TABLET | Freq: Every day | ORAL | 1 refills | Status: DC

## 2023-06-28 MED ORDER — BUPROPION HCL ER (XL) 300 MG PO TB24: 300.0000 mg | ORAL_TABLET | Freq: Every day | ORAL | 3 refills | Status: DC

## 2023-06-28 MED ORDER — AMLODIPINE BESYLATE 5 MG PO TABS: 5.0000 mg | ORAL_TABLET | Freq: Every day | ORAL | 3 refills | Status: DC

## 2023-06-28 MED ORDER — OMEPRAZOLE 40 MG PO CPDR: 40.0000 mg | DELAYED_RELEASE_CAPSULE | Freq: Every day | ORAL | 2 refills | Status: DC

## 2023-06-28 MED ORDER — ONDANSETRON HCL 4 MG PO TABS: 4.0000 mg | ORAL_TABLET | Freq: Three times a day (TID) | ORAL | 4 refills | Status: AC | PRN

## 2023-06-28 MED ORDER — GABAPENTIN 300 MG PO CAPS: 300.0000 mg | ORAL_CAPSULE | Freq: Three times a day (TID) | ORAL | 2 refills | Status: DC

## 2023-06-28 MED ORDER — ROSUVASTATIN CALCIUM 20 MG PO TABS: 10.0000 mg | ORAL_TABLET | Freq: Every day | ORAL | 3 refills | Status: DC

## 2023-06-28 NOTE — Progress Notes (Signed)
 Established Patient Office Visit  Subjective:  Patient ID: Chloe Reeves, female    DOB: 1971/02/11  Age: 53 y.o. MRN: [id number]  Chief Complaint  Patient presents with   Follow-up    Fill out paperwork and discuss meds    Patient comes in for follow-up today.  She has not been in since March 2024.  Reports that she ran out of insurance and was not able to come for her visits.  And then she also could not buy her medications and has been completely out of them for a few months.  Today her blood pressure is high, feels very anxious.  Her PHQ-9/GAD score is 10/12.  She has 2 emotional support animals which have not helped her so far.   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 She was supposed to be on Norvasc  and benazepril .  Her blood pressure is 130/90.  Will restart Norvasc  at 5 mg and monitor blood pressure.  If needed will add benazepril . Will get blood work today. She missed her mammogram and the repeat Pap smear, was HPV positive.    No other concerns at this time.   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 Margueritte   ESOPHAGOGASTRODUODENOSCOPY (EGD) WITH PROPOFOL  N/A 03/20/2016   Procedure: ESOPHAGOGASTRODUODENOSCOPY (EGD) WITH PROPOFOL ;  Surgeon: Rogelia Copping, MD;  Location: [facility];  Service: Endoscopy;  Laterality: N/A;   LAPAROSCOPIC GASTRIC SLEEVE RESECTION N/A 2014   LAPAROSCOPY N/A 03/16/2016   Procedure: LAPAROSCOPY DIAGNOSTIC;  Surgeon: Elgin Laurence III, MD;  Location: [facility];  Service: General;   Laterality: N/A;    Social History   Socioeconomic History   Marital status: Single    Spouse name: Not on file   Number of children: Not on file   Years of education: Not on file   Highest education level: Not on file  Occupational History   Not on file  Tobacco Use   Smoking status: Former   Smokeless tobacco: Never  Substance and Sexual Activity   Alcohol use: Yes    Comment: occassional   Drug use: No   Sexual activity: Not Currently  Other Topics Concern   Not on file  Social History Narrative   Not on file   Social Drivers of Health   Financial Resource Strain: Not on file  Food Insecurity: Not on file  Transportation Needs: Not on file  Physical Activity: Not on file  Stress: Not on file  Social Connections: Not on file  Intimate Partner Violence: Not on file    Family History  Problem Relation Age of Onset   Cancer Other        pt is unsure of details regarding FH of cancer    Allergies  Allergen Reactions   Scopolamine Other (See Comments)    Dizziness, hot flashes, passes out   Benadryl [Diphenhydramine Hcl] Rash    Outpatient Medications Prior to Visit  Medication Sig  Note   benazepril  (LOTENSIN ) 10 MG tablet Take 1 tablet (10 mg total) by mouth daily.    busPIRone  (BUSPAR ) 15 MG tablet TAKE 1 TABLET BY MOUTH TWICE A DAY    [DISCONTINUED] amLODipine  (NORVASC ) 5 MG tablet Take 1 tablet (5 mg total) by mouth daily.    [DISCONTINUED] buPROPion  (WELLBUTRIN  XL) 300 MG 24 hr tablet Take 1 tablet (300 mg total) by mouth daily.    [DISCONTINUED] celecoxib  (CELEBREX ) 200 MG capsule Take 200 mg by mouth daily as needed.    [DISCONTINUED] gabapentin  (NEURONTIN ) 300 MG capsule Take 1 capsule (300 mg total) by mouth 3 (three) times daily.    [DISCONTINUED] omeprazole  (PRILOSEC) 40 MG capsule Take 1 capsule (40 mg total) by mouth daily.    [DISCONTINUED] rOPINIRole  (REQUIP ) 0.25 MG tablet Take 0.25 mg by mouth daily.    [DISCONTINUED] rosuvastatin  (CRESTOR )  20 MG tablet Take 0.5 tablets (10 mg total) by mouth daily.    [DISCONTINUED] ondansetron  (ZOFRAN ) 4 MG tablet Take 1 tablet (4 mg total) by mouth every 8 (eight) hours as needed for nausea or vomiting. (Patient not taking: Reported on 06/28/2023) 06/28/2023: Needs refill   No facility-administered medications prior to visit.    Review of Systems  Constitutional: Negative.  Negative for chills, fever, malaise/fatigue and weight loss.  HENT: Negative.  Negative for congestion and sinus pain.   Eyes: Negative.   Respiratory: Negative.  Negative for cough, shortness of breath and stridor.   Cardiovascular: Negative.  Negative for chest pain, palpitations and leg swelling.  Gastrointestinal: Negative.  Negative for abdominal pain, constipation, diarrhea, heartburn, nausea and vomiting.  Genitourinary: Negative.  Negative for dysuria and flank pain.  Musculoskeletal: Negative.  Negative for joint pain and myalgias.  Skin: Negative.   Neurological:  Negative for dizziness, tingling, tremors, sensory change and headaches.  Endo/Heme/Allergies: Negative.   Psychiatric/Behavioral: Negative.  Negative for depression and suicidal ideas. The patient is not nervous/anxious.        Objective:   BP (!) 130/90   Pulse 75   Ht 5' 2 (1.575 m)   Wt 242 lb 12.8 oz (110.1 kg)   LMP 03/27/2016   SpO2 98%   BMI 44.41 kg/m   Vitals:   06/28/23 1501  BP: (!) 130/90  Pulse: 75  Height: 5' 2 (1.575 m)  Weight: 242 lb 12.8 oz (110.1 kg)  SpO2: 98%  BMI (Calculated): 44.4    Physical Exam Vitals and nursing note reviewed.  Constitutional:      Appearance: Normal appearance.  HENT:     Head: Normocephalic and atraumatic.     Nose: Nose normal.     Mouth/Throat:     Mouth: Mucous membranes are moist.     Pharynx: Oropharynx is clear.  Eyes:     Conjunctiva/sclera: Conjunctivae normal.     Pupils: Pupils are equal, round, and reactive to light.  Cardiovascular:     Rate and Rhythm: Normal  rate and regular rhythm.     Pulses: Normal pulses.     Heart sounds: Normal heart sounds. No murmur heard. Pulmonary:     Effort: Pulmonary effort is normal.     Breath sounds: Normal breath sounds. No wheezing.  Abdominal:     General: Bowel sounds are normal.     Palpations: Abdomen is soft.     Tenderness: There is no abdominal tenderness. There is no right CVA tenderness or left CVA tenderness.  Musculoskeletal:        General: Normal  range of motion.     Cervical back: Normal range of motion.     Right lower leg: No edema.     Left lower leg: No edema.  Skin:    General: Skin is warm and dry.  Neurological:     General: No focal deficit present.     Mental Status: She is alert and oriented to person, place, and time.  Psychiatric:        Mood and Affect: Mood normal.        Behavior: Behavior normal.      No results found for any visits on 06/28/23.  No results found for this or any previous visit (from the past 2160 hours).    Assessment & Plan:  Resume medications.  Check lab work.  Patient needs to return for a Pap smear.  Her last 1 was HPV positive Problem List Items Addressed This Visit     Bariatric surgery status   Relevant Medications   omeprazole  (PRILOSEC) 40 MG capsule   Essential hypertension, benign   Relevant Medications   amLODipine  (NORVASC ) 5 MG tablet   rosuvastatin  (CRESTOR ) 20 MG tablet   Other Relevant Orders   CMP14+EGFR   GAD (generalized anxiety disorder)   Relevant Medications   buPROPion  (WELLBUTRIN  XL) 300 MG 24 hr tablet   Mixed hyperlipidemia - Primary   Relevant Medications   amLODipine  (NORVASC ) 5 MG tablet   rosuvastatin  (CRESTOR ) 20 MG tablet   Other Relevant Orders   Lipid Panel w/o Chol/HDL Ratio   Other Visit Diagnoses       RLS (restless legs syndrome)       Relevant Medications   gabapentin  (NEURONTIN ) 300 MG capsule   celecoxib  (CELEBREX ) 200 MG capsule   rOPINIRole  (REQUIP ) 0.25 MG tablet     Nausea        Relevant Medications   ondansetron  (ZOFRAN ) 4 MG tablet     Gastroesophageal reflux disease without esophagitis       Relevant Medications   omeprazole  (PRILOSEC) 40 MG capsule   ondansetron  (ZOFRAN ) 4 MG tablet       Return in about 4 weeks (around 07/26/2023).   Total time spent: 30 minutes  FERNAND FREDY RAMAN, MD  06/28/2023   This document may have been prepared by [facility] Voice Recognition software and as such may include unintentional dictation errors.

## 2023-06-29 LAB — CMP14+EGFR
ALT: 19 [IU]/L (ref 0–32)
AST: 19 [IU]/L (ref 0–40)
Albumin: 4.1 g/dL (ref 3.8–4.9)
Alkaline Phosphatase: 109 [IU]/L (ref 44–121)
BUN/Creatinine Ratio: 18 (ref 9–23)
BUN: 11 mg/dL (ref 6–24)
Bilirubin Total: 0.2 mg/dL (ref 0.0–1.2)
CO2: 27 mmol/L (ref 20–29)
Calcium: 9 mg/dL (ref 8.7–10.2)
Chloride: 100 mmol/L (ref 96–106)
Creatinine, Ser: 0.6 mg/dL (ref 0.57–1.00)
Globulin, Total: 2.4 g/dL (ref 1.5–4.5)
Glucose: 92 mg/dL (ref 70–99)
Potassium: 4.3 mmol/L (ref 3.5–5.2)
Sodium: 139 mmol/L (ref 134–144)
Total Protein: 6.5 g/dL (ref 6.0–8.5)
eGFR: 108 mL/min/{1.73_m2} (ref >59)

## 2023-06-29 LAB — LIPID PANEL W/O CHOL/HDL RATIO
Cholesterol, Total: 311 mg/dL — ABNORMAL HIGH (ref 100–199)
HDL: 81 mg/dL (ref >39)
LDL Chol Calc (NIH): 207 mg/dL — ABNORMAL HIGH (ref 0–99)
Triglycerides: 132 mg/dL (ref 0–149)
VLDL Cholesterol Cal: 23 mg/dL (ref 5–40)

## 2023-07-01 ENCOUNTER — Encounter: Payer: Self-pay | Admitting: Internal Medicine

## 2023-07-01 ENCOUNTER — Other Ambulatory Visit: Payer: Self-pay | Admitting: Internal Medicine

## 2023-07-01 DIAGNOSIS — F411 Generalized anxiety disorder: Secondary | ICD-10-CM

## 2023-07-01 MED ORDER — BUSPIRONE HCL 15 MG PO TABS: 15.0000 mg | ORAL_TABLET | Freq: Two times a day (BID) | ORAL | 0 refills | Status: DC

## 2023-07-01 NOTE — Progress Notes (Signed)
 Patient notified

## 2023-07-25 ENCOUNTER — Ambulatory Visit: Payer: 59 | Admitting: Internal Medicine

## 2023-09-18 ENCOUNTER — Other Ambulatory Visit: Payer: Self-pay | Admitting: Internal Medicine

## 2023-09-18 DIAGNOSIS — G2581 Restless legs syndrome: Secondary | ICD-10-CM

## 2023-09-22 ENCOUNTER — Other Ambulatory Visit: Payer: Self-pay | Admitting: Internal Medicine

## 2023-09-22 DIAGNOSIS — G2581 Restless legs syndrome: Secondary | ICD-10-CM

## 2023-11-02 ENCOUNTER — Other Ambulatory Visit: Payer: Self-pay | Admitting: Internal Medicine

## 2023-11-02 DIAGNOSIS — G2581 Restless legs syndrome: Secondary | ICD-10-CM

## 2023-12-03 ENCOUNTER — Telehealth: Payer: Self-pay | Admitting: Internal Medicine

## 2023-12-03 ENCOUNTER — Other Ambulatory Visit: Payer: Self-pay | Admitting: Internal Medicine

## 2023-12-03 MED ORDER — POLYMYXIN B-TRIMETHOPRIM 10000-0.1 UNIT/ML-% OP SOLN: 2.0000 [drp] | Freq: Four times a day (QID) | OPHTHALMIC | 0 refills | Status: AC

## 2023-12-03 MED ORDER — BACITRACIN-POLYMYXIN B 500-10000 UNIT/GM OP OINT: TOPICAL_OINTMENT | Freq: Every day | OPHTHALMIC | 0 refills | Status: AC

## 2023-12-03 NOTE — Telephone Encounter (Signed)
 Patient left VM stating she scratched her eye, says this happened before and Dr. Meredeth Stallion prescribed her some eye drops and ointment. Wants to know if we can send this again without her coming in. If so, please send to [facility].

## 2023-12-10 ENCOUNTER — Ambulatory Visit: Admitting: Internal Medicine

## 2023-12-10 ENCOUNTER — Encounter: Payer: Self-pay | Admitting: Internal Medicine

## 2023-12-10 VITALS — BP 150/102 | HR 78 | Ht 62.0 in | Wt 235.4 lb

## 2023-12-10 DIAGNOSIS — G2581 Restless legs syndrome: Secondary | ICD-10-CM | POA: Insufficient documentation

## 2023-12-10 DIAGNOSIS — Z1272 Encounter for screening for malignant neoplasm of vagina: Secondary | ICD-10-CM

## 2023-12-10 DIAGNOSIS — R8789 Other abnormal findings in specimens from female genital organs: Secondary | ICD-10-CM

## 2023-12-10 DIAGNOSIS — I10 Essential (primary) hypertension: Secondary | ICD-10-CM | POA: Diagnosis not present

## 2023-12-10 DIAGNOSIS — K219 Gastro-esophageal reflux disease without esophagitis: Secondary | ICD-10-CM

## 2023-12-10 DIAGNOSIS — Z1151 Encounter for screening for human papillomavirus (HPV): Secondary | ICD-10-CM

## 2023-12-10 DIAGNOSIS — R7302 Impaired glucose tolerance (oral): Secondary | ICD-10-CM

## 2023-12-10 DIAGNOSIS — E782 Mixed hyperlipidemia: Secondary | ICD-10-CM

## 2023-12-10 DIAGNOSIS — F411 Generalized anxiety disorder: Secondary | ICD-10-CM

## 2023-12-10 DIAGNOSIS — Z124 Encounter for screening for malignant neoplasm of cervix: Secondary | ICD-10-CM

## 2023-12-10 MED ORDER — ROSUVASTATIN CALCIUM 20 MG PO TABS: 10.0000 mg | ORAL_TABLET | Freq: Every day | ORAL | 3 refills | Status: AC

## 2023-12-10 MED ORDER — BENAZEPRIL HCL 10 MG PO TABS: 10.0000 mg | ORAL_TABLET | Freq: Every day | ORAL | 3 refills | Status: AC

## 2023-12-10 NOTE — Addendum Note (Signed)
 Addended byFrancie Irani on: 12/10/2023 02:55 PM   Modules accepted: Orders

## 2023-12-10 NOTE — Progress Notes (Signed)
 Established Patient Office Visit  Subjective:  Patient ID: Chloe Reeves, female    DOB: Jul 06, 1970  Age: 53 y.o. MRN: [id number]  Chief Complaint  Patient presents with   Follow-up    PAP    Patient comes in for follow-up.  Also needs a repeat Pap as her last one was HPV positive.  Her blood pressure is high today.  Currently she is on Norvasc  5 mg/day.  Will restart her benazepril  as well.  New prescription sent. Patient continues to have right pain discomfort.  She just completed her eyedrops and antibiotic cream.  Needs to be seen by an eye doctor.  Mentions stress at home and at work.  Needs to monitor her blood pressure at home also. Needs mammogram but will check with her insurance company.     No other concerns at this time.   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 Lucianne Rutherford   ESOPHAGOGASTRODUODENOSCOPY (EGD) WITH PROPOFOL  N/A 03/20/2016   Procedure: ESOPHAGOGASTRODUODENOSCOPY (EGD) WITH PROPOFOL ;  Surgeon: Marnee Sink, MD;  Location: [facility];  Service: Endoscopy;  Laterality: N/A;   LAPAROSCOPIC GASTRIC SLEEVE RESECTION N/A 2014   LAPAROSCOPY N/A 03/16/2016   Procedure: LAPAROSCOPY DIAGNOSTIC;  Surgeon: Rhina Center III, MD;  Location: [facility];  Service: General;  Laterality: N/A;    Social History   Socioeconomic History   Marital status: Single    Spouse name: Not on file   Number of children: Not on file   Years of education: Not on file   Highest education level: Not on file  Occupational History   Not on file  Tobacco Use    Smoking status: Former   Smokeless tobacco: Never  Substance and Sexual Activity   Alcohol use: Yes    Comment: occassional   Drug use: No   Sexual activity: Not Currently  Other Topics Concern   Not on file  Social History Narrative   Not on file   Social Drivers of Health   Financial Resource Strain: Not on file  Food Insecurity: Not on file  Transportation Needs: Not on file  Physical Activity: Not on file  Stress: Not on file  Social Connections: Not on file  Intimate Partner Violence: Not on file    Family History  Problem Relation Age of Onset   Cancer Other        pt is unsure of details regarding FH of cancer    Allergies  Allergen Reactions   Scopolamine Other (See Comments)    Dizziness, hot flashes, passes out   Benadryl [Diphenhydramine Hcl] Rash    Outpatient Medications Prior to Visit  Medication Sig   amLODipine  (NORVASC ) 5 MG tablet Take 1 tablet (5 mg total) by mouth daily.   bacitracin -polymyxin b  (POLYSPORIN ) ophthalmic ointment Place into both eyes at bedtime for 7 days. Place a 1/2 inch ribbon of ointment into the lower eyelid.   buPROPion  (WELLBUTRIN  XL) 300 MG 24 hr tablet Take 1 tablet (300  mg total) by mouth daily.   busPIRone  (BUSPAR ) 15 MG tablet Take 1 tablet (15 mg total) by mouth 2 (two) times daily.   celecoxib  (CELEBREX ) 200 MG capsule TAKE 1 CAPSULE BY MOUTH ONCE DAILY AS NEEDED   gabapentin  (NEURONTIN ) 300 MG capsule TAKE 1 CAPSULE BY MOUTH THREE TIMES DAILY   omeprazole  (PRILOSEC) 40 MG capsule Take 1 capsule (40 mg total) by mouth daily.   ondansetron  (ZOFRAN ) 4 MG tablet Take 1 tablet (4 mg total) by mouth every 8 (eight) hours as needed for nausea or vomiting.   rOPINIRole  (REQUIP ) 0.25 MG tablet Take 1 tablet (0.25 mg total) by mouth daily.   trimethoprim -polymyxin b  (POLYTRIM ) ophthalmic solution Place 2 drops into both eyes every 6 (six) hours for 7 days.   [DISCONTINUED] benazepril  (LOTENSIN ) 10 MG tablet Take 1 tablet (10 mg  total) by mouth daily.   [DISCONTINUED] rosuvastatin  (CRESTOR ) 20 MG tablet Take 0.5 tablets (10 mg total) by mouth daily.   No facility-administered medications prior to visit.    Review of Systems  Constitutional: Negative.  Negative for chills, fever, malaise/fatigue and weight loss.  HENT: Negative.  Negative for sore throat.   Eyes:  Positive for pain and discharge. Negative for double vision, photophobia and redness.  Respiratory: Negative.  Negative for cough and shortness of breath.   Cardiovascular: Negative.  Negative for chest pain, palpitations and leg swelling.  Gastrointestinal: Negative.  Negative for abdominal pain, constipation, diarrhea, heartburn, nausea and vomiting.  Genitourinary: Negative.  Negative for dysuria and flank pain.  Musculoskeletal: Negative.  Negative for joint pain and myalgias.  Skin: Negative.   Neurological: Negative.  Negative for dizziness, tingling, tremors and headaches.  Endo/Heme/Allergies: Negative.   Psychiatric/Behavioral: Negative.  Negative for depression and suicidal ideas. The patient is not nervous/anxious.        Objective:   BP (!) 150/102   Pulse 78   Ht 5' 2 (1.575 m)   Wt 235 lb 6.4 oz (106.8 kg)   LMP 03/27/2016   SpO2 97%   BMI 43.06 kg/m   Vitals:   12/10/23 1110  BP: (!) 150/102  Pulse: 78  Height: 5' 2 (1.575 m)  Weight: 235 lb 6.4 oz (106.8 kg)  SpO2: 97%  BMI (Calculated): 43.04    Physical Exam Vitals and nursing note reviewed.  Constitutional:      Appearance: Normal appearance.  HENT:     Head: Normocephalic and atraumatic.     Nose: Nose normal.     Mouth/Throat:     Mouth: Mucous membranes are moist.     Pharynx: Oropharynx is clear.   Eyes:     Conjunctiva/sclera: Conjunctivae normal.     Pupils: Pupils are equal, round, and reactive to light.    Cardiovascular:     Rate and Rhythm: Normal rate and regular rhythm.     Pulses: Normal pulses.     Heart sounds: Normal heart sounds. No  murmur heard. Pulmonary:     Effort: Pulmonary effort is normal.     Breath sounds: Normal breath sounds. No wheezing.  Abdominal:     General: Bowel sounds are normal.     Palpations: Abdomen is soft.     Tenderness: There is no abdominal tenderness. There is no right CVA tenderness or left CVA tenderness.   Musculoskeletal:        General: Normal range of motion.     Cervical back: Normal range of motion.     Right lower leg:  No edema.     Left lower leg: No edema.   Skin:    General: Skin is warm and dry.   Neurological:     General: No focal deficit present.     Mental Status: She is alert and oriented to person, place, and time.   Psychiatric:        Mood and Affect: Mood normal.        Behavior: Behavior normal.      No results found for any visits on 12/10/23.  No results found for this or any previous visit (from the past 2160 hours).    Assessment & Plan:  Continue all medications.  Add benazepril .  Monitor blood pressure.  Patient will make her appointment with the eye doctor.  Will schedule mammogram once approved by her insurance. Problem List Items Addressed This Visit     Essential hypertension, benign - Primary   Relevant Medications   benazepril  (LOTENSIN ) 10 MG tablet   rosuvastatin  (CRESTOR ) 20 MG tablet   Other Relevant Orders   CMP14+EGFR   GAD (generalized anxiety disorder)   Mixed hyperlipidemia   Relevant Medications   benazepril  (LOTENSIN ) 10 MG tablet   rosuvastatin  (CRESTOR ) 20 MG tablet   Other Relevant Orders   Lipid Panel w/o Chol/HDL Ratio   Impaired glucose tolerance   RLS (restless legs syndrome)   Other Visit Diagnoses       Gastroesophageal reflux disease without esophagitis         Screening for cervical cancer         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   Total time spent: 30 minutes  Aisha Hove, MD  12/10/2023   This document may have  been prepared by [facility] Voice Recognition software and as such may include unintentional dictation errors.

## 2023-12-11 LAB — CMP14+EGFR
ALT: 20 IU/L (ref 0–32)
AST: 25 IU/L (ref 0–40)
Albumin: 4.5 g/dL (ref 3.8–4.9)
Alkaline Phosphatase: 106 IU/L (ref 44–121)
BUN/Creatinine Ratio: 19 (ref 9–23)
BUN: 13 mg/dL (ref 6–24)
Bilirubin Total: 0.4 mg/dL (ref 0.0–1.2)
CO2: 26 mmol/L (ref 20–29)
Calcium: 9.8 mg/dL (ref 8.7–10.2)
Chloride: 104 mmol/L (ref 96–106)
Creatinine, Ser: 0.69 mg/dL (ref 0.57–1.00)
Globulin, Total: 2.4 g/dL (ref 1.5–4.5)
Glucose: 116 mg/dL — ABNORMAL HIGH (ref 70–99)
Potassium: 4.9 mmol/L (ref 3.5–5.2)
Sodium: 144 mmol/L (ref 134–144)
Total Protein: 6.9 g/dL (ref 6.0–8.5)
eGFR: 104 mL/min/{1.73_m2} (ref >59)

## 2023-12-11 LAB — LIPID PANEL W/O CHOL/HDL RATIO
Cholesterol, Total: 154 mg/dL (ref 100–199)
HDL: 75 mg/dL (ref >39)
LDL Chol Calc (NIH): 67 mg/dL (ref 0–99)
Triglycerides: 61 mg/dL (ref 0–149)
VLDL Cholesterol Cal: 12 mg/dL (ref 5–40)

## 2023-12-12 ENCOUNTER — Ambulatory Visit: Payer: Self-pay | Admitting: Internal Medicine

## 2023-12-12 LAB — IGP, APTIMA HPV
HPV Aptima: NEGATIVE
PAP Smear Comment: 0

## 2023-12-13 NOTE — Progress Notes (Signed)
Pt informed

## 2023-12-24 ENCOUNTER — Ambulatory Visit: Admitting: Internal Medicine

## 2023-12-31 ENCOUNTER — Other Ambulatory Visit: Payer: Self-pay | Admitting: Internal Medicine

## 2023-12-31 DIAGNOSIS — G2581 Restless legs syndrome: Secondary | ICD-10-CM

## 2024-01-31 ENCOUNTER — Other Ambulatory Visit: Payer: Self-pay | Admitting: Internal Medicine

## 2024-01-31 ENCOUNTER — Other Ambulatory Visit: Payer: Self-pay | Admitting: Family

## 2024-01-31 DIAGNOSIS — F411 Generalized anxiety disorder: Secondary | ICD-10-CM

## 2024-01-31 DIAGNOSIS — G2581 Restless legs syndrome: Secondary | ICD-10-CM

## 2024-04-09 ENCOUNTER — Other Ambulatory Visit: Payer: Self-pay | Admitting: Cardiology

## 2024-04-09 ENCOUNTER — Other Ambulatory Visit: Payer: Self-pay | Admitting: Internal Medicine

## 2024-04-09 DIAGNOSIS — G2581 Restless legs syndrome: Secondary | ICD-10-CM

## 2024-04-12 ENCOUNTER — Other Ambulatory Visit: Payer: Self-pay | Admitting: Cardiology

## 2024-04-12 DIAGNOSIS — F411 Generalized anxiety disorder: Secondary | ICD-10-CM

## 2024-04-27 ENCOUNTER — Other Ambulatory Visit: Payer: Self-pay | Admitting: Internal Medicine

## 2024-04-27 DIAGNOSIS — Z9884 Bariatric surgery status: Secondary | ICD-10-CM

## 2024-04-27 DIAGNOSIS — K219 Gastro-esophageal reflux disease without esophagitis: Secondary | ICD-10-CM

## 2024-07-11 ENCOUNTER — Other Ambulatory Visit: Payer: Self-pay | Admitting: Internal Medicine

## 2024-07-11 DIAGNOSIS — F411 Generalized anxiety disorder: Secondary | ICD-10-CM

## 2024-07-11 DIAGNOSIS — I10 Essential (primary) hypertension: Secondary | ICD-10-CM
